# Patient Record
Sex: Male | Born: 1995 | Race: Black or African American | Hispanic: No | Marital: Single | Smoking: Current every day smoker
Health system: Southern US, Community
[De-identification: ages and names within clinical notes are randomized; demographics above are authoritative.]

## PROBLEM LIST (undated history)

## (undated) DIAGNOSIS — J45909 Unspecified asthma, uncomplicated: Secondary | ICD-10-CM

---
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
## 2017-03-02 ENCOUNTER — Encounter: Payer: Self-pay | Admitting: Dietician

## 2017-03-02 NOTE — Progress Notes (Signed)
Medical Nutrition Therapy:  Appt start time: 12:45pm end time:  1:15pm   Assessment:  Primary concerns today: healthy weight and muscle gain in the off-season   Learning Readiness:  Ready  MEDICATIONS: see chart   DIETARY INTAKE: Usual eating pattern includes 3 meals and 0 snacks per day. Lunch and dinner vary depending on what is served in [employer] dining halls.  Everyday foods include: fruit, usually chicken.  Avoided foods include: yogurt, cottage cheese.    24-hr recall:  B ( AM): sausage + fruit + cereal or biscuit Snk ( AM): n/a L ( PM): chicken, hot dog, [place] to order chicken tenders + fries Snk ( PM): n/a D ( PM): similar to lunch Snk ( PM): n/a Beverages: water, Gatorade, PowerAde  Usual physical activity: football player. Current schedule calls for 3-4 days/wk workout, lifting focus.  Estimated energy needs: 2786 calories (maintenance) 348 g carbohydrates 162 g protein 83 g fat   Nutritional Diagnosis: Nutrition- related knowledge deficit r/t methods of weight gain and sources of dietary carbohydrates AEB patient's inability to name sources of carbohydrates and stated lack of knowledge regarding how to gain weight in a healthy manor.    Intervention:  Total wt gain goal: 10-12# where weight is coming from increase in muscle mass with minimal fat gain. Seeking knowledge of how to put on weight in a healthy way. Recommendations: adding an additional 250-500kcal /day for healthy wt gain of 1/2 - 2# / week. Interventions discussed include: creating balanced meals, choosing a diet high in both calories and quality, using guidelines for percent of total daily calories that should come from [facility], PRO, Fat for football players, addressing meal timing around workouts, choosing better quality protein sources at breakfast, adding in a snack each day, finding ways to "sneak in" calories at meal times (dressings, condiments or dressings, guacamole, butter, granola, EVOO, dried  fruit, cheese, nuts/ nut butters, trail mix, 2% milk over skim, bars, protein shakes only in a pinch or post workout). Ability to utilize liquid calories also discussed.  Teaching Method Utilized: Auditory  Handouts given during visit include:  email  Barriers to learning/adherence to lifestyle change: poor quality diet, needs addressed before healthy wt gain can be fully addressed  Demonstrated degree of understanding via:  Teach Back   Monitoring/Evaluation:  Dietary intake, exercise, and body weight

## 2017-05-27 ENCOUNTER — Emergency Department: Payer: Medicaid Other

## 2017-05-27 ENCOUNTER — Observation Stay
Admission: EM | Admit: 2017-05-27 | Discharge: 2017-05-28 | Disposition: A | Discharge status: Home / Self Care | Payer: Medicaid Other | Attending: Internal Medicine | Admitting: Internal Medicine

## 2017-05-27 ENCOUNTER — Other Ambulatory Visit: Payer: Self-pay

## 2017-05-27 DIAGNOSIS — J189 Pneumonia, unspecified organism: Secondary | ICD-10-CM | POA: Diagnosis present

## 2017-05-27 DIAGNOSIS — R0603 Acute respiratory distress: Secondary | ICD-10-CM | POA: Diagnosis present

## 2017-05-27 DIAGNOSIS — J209 Acute bronchitis, unspecified: Secondary | ICD-10-CM | POA: Insufficient documentation

## 2017-05-27 DIAGNOSIS — J9801 Acute bronchospasm: Secondary | ICD-10-CM | POA: Diagnosis present

## 2017-05-27 DIAGNOSIS — Z882 Allergy status to sulfonamides status: Secondary | ICD-10-CM | POA: Insufficient documentation

## 2017-05-27 DIAGNOSIS — J9600 Acute respiratory failure, unspecified whether with hypoxia or hypercapnia: Secondary | ICD-10-CM

## 2017-05-27 DIAGNOSIS — J4541 Moderate persistent asthma with (acute) exacerbation: Secondary | ICD-10-CM

## 2017-05-27 DIAGNOSIS — F17210 Nicotine dependence, cigarettes, uncomplicated: Secondary | ICD-10-CM | POA: Insufficient documentation

## 2017-05-27 DIAGNOSIS — R531 Weakness: Secondary | ICD-10-CM | POA: Insufficient documentation

## 2017-05-27 DIAGNOSIS — J9601 Acute respiratory failure with hypoxia: Principal | ICD-10-CM | POA: Insufficient documentation

## 2017-05-27 DIAGNOSIS — J9690 Respiratory failure, unspecified, unspecified whether with hypoxia or hypercapnia: Secondary | ICD-10-CM | POA: Diagnosis present

## 2017-05-27 LAB — URINE DRUG SCREEN, QUALITATIVE (ARMC ONLY)
Amphetamines, Ur Screen: NOT DETECTED
Barbiturates, Ur Screen: NOT DETECTED
Benzodiazepine, Ur Scrn: NOT DETECTED
COCAINE METABOLITE, UR ~~LOC~~: NOT DETECTED
Cannabinoid 50 Ng, Ur ~~LOC~~: POSITIVE — AB
MDMA (ECSTASY) UR SCREEN: NOT DETECTED
METHADONE SCREEN, URINE: NOT DETECTED
OPIATE, UR SCREEN: POSITIVE — AB
Phencyclidine (PCP) Ur S: NOT DETECTED
TRICYCLIC, UR SCREEN: NOT DETECTED

## 2017-05-27 LAB — CBC WITH DIFFERENTIAL/PLATELET
BASOS PCT: 0 %
Basophils Absolute: 0 10*3/uL (ref 0–0.1)
EOS ABS: 0.3 10*3/uL (ref 0–0.7)
Eosinophils Relative: 2 %
HCT: 50.7 % (ref 40.0–52.0)
Hemoglobin: 17.2 g/dL (ref 13.0–18.0)
Lymphocytes Relative: 8 %
Lymphs Abs: 1.1 10*3/uL (ref 1.0–3.6)
MCH: 28.6 pg (ref 26.0–34.0)
MCHC: 34 g/dL (ref 32.0–36.0)
MCV: 84.2 fL (ref 80.0–100.0)
MONO ABS: 1.3 10*3/uL — AB (ref 0.2–1.0)
MONOS PCT: 10 %
Neutro Abs: 10.2 10*3/uL — ABNORMAL HIGH (ref 1.4–6.5)
Neutrophils Relative %: 80 %
PLATELETS: 159 10*3/uL (ref 150–440)
RBC: 6.02 MIL/uL — ABNORMAL HIGH (ref 4.40–5.90)
RDW: 13.1 % (ref 11.5–14.5)
WBC: 12.8 10*3/uL — ABNORMAL HIGH (ref 3.8–10.6)

## 2017-05-27 LAB — URINALYSIS, ROUTINE W REFLEX MICROSCOPIC
Bilirubin Urine: NEGATIVE
GLUCOSE, UA: NEGATIVE mg/dL
Hgb urine dipstick: NEGATIVE
KETONES UR: NEGATIVE mg/dL
LEUKOCYTES UA: NEGATIVE
NITRITE: NEGATIVE
PH: 6 (ref 5.0–8.0)
Protein, ur: NEGATIVE mg/dL
SPECIFIC GRAVITY, URINE: 1.029 (ref 1.005–1.030)

## 2017-05-27 LAB — RAPID HIV SCREEN (HIV 1/2 AB+AG)
HIV 1/2 Antibodies: NONREACTIVE
HIV-1 P24 ANTIGEN - HIV24: NONREACTIVE

## 2017-05-27 LAB — BASIC METABOLIC PANEL
Anion gap: 9 (ref 5–15)
BUN: 10 mg/dL (ref 6–20)
CO2: 25 mmol/L (ref 22–32)
CREATININE: 1.05 mg/dL (ref 0.61–1.24)
Calcium: 9.7 mg/dL (ref 8.9–10.3)
Chloride: 101 mmol/L (ref 101–111)
Glucose, Bld: 99 mg/dL (ref 65–99)
Potassium: 3.9 mmol/L (ref 3.5–5.1)
SODIUM: 135 mmol/L (ref 135–145)

## 2017-05-27 LAB — TROPONIN I

## 2017-05-27 LAB — LACTIC ACID, PLASMA: Lactic Acid, Venous: 0.7 mmol/L (ref 0.5–1.9)

## 2017-05-27 MED ORDER — NICOTINE 21 MG/24HR TD PT24
21.0000 mg | MEDICATED_PATCH | Freq: Every day | TRANSDERMAL | Status: DC
  Administered 2017-05-27: 21 mg via TRANSDERMAL
  Filled 2017-05-27: qty 1

## 2017-05-27 MED ORDER — METHYLPREDNISOLONE SODIUM SUCC 125 MG IJ SOLR
125.0000 mg | Freq: Once | INTRAMUSCULAR | Status: AC
  Administered 2017-05-27: 125 mg via INTRAVENOUS
  Filled 2017-05-27: qty 2

## 2017-05-27 MED ORDER — ALBUTEROL SULFATE (2.5 MG/3ML) 0.083% IN NEBU
2.5000 mg | INHALATION_SOLUTION | Freq: Once | RESPIRATORY_TRACT | Status: AC
  Administered 2017-05-27: 2.5 mg via RESPIRATORY_TRACT

## 2017-05-27 MED ORDER — IPRATROPIUM-ALBUTEROL 0.5-2.5 (3) MG/3ML IN SOLN
3.0000 mL | RESPIRATORY_TRACT | Status: DC
  Administered 2017-05-27 – 2017-05-28 (×6): 3 mL via RESPIRATORY_TRACT
  Filled 2017-05-27 (×7): qty 3

## 2017-05-27 MED ORDER — BISACODYL 5 MG PO TBEC
5.0000 mg | DELAYED_RELEASE_TABLET | Freq: Every day | ORAL | Status: DC | PRN
  Filled 2017-05-27: qty 1

## 2017-05-27 MED ORDER — DOXYCYCLINE HYCLATE 100 MG IV SOLR
100.0000 mg | Freq: Once | INTRAVENOUS | Status: DC
  Filled 2017-05-27: qty 100

## 2017-05-27 MED ORDER — MORPHINE SULFATE (PF) 4 MG/ML IV SOLN
4.0000 mg | Freq: Once | INTRAVENOUS | Status: AC
  Administered 2017-05-27: 4 mg via INTRAVENOUS
  Filled 2017-05-27: qty 1

## 2017-05-27 MED ORDER — ACETAMINOPHEN 325 MG PO TABS: 650.0000 mg | ORAL_TABLET | Freq: Four times a day (QID) | ORAL | Status: DC | PRN

## 2017-05-27 MED ORDER — ACETAMINOPHEN 650 MG RE SUPP: 650.0000 mg | Freq: Four times a day (QID) | RECTAL | Status: DC | PRN

## 2017-05-27 MED ORDER — PIPERACILLIN-TAZOBACTAM 3.375 G IVPB
3.3750 g | Freq: Three times a day (TID) | INTRAVENOUS | Status: DC
  Administered 2017-05-27 – 2017-05-28 (×2): 3.375 g via INTRAVENOUS
  Filled 2017-05-27 (×2): qty 50

## 2017-05-27 MED ORDER — ONDANSETRON HCL 4 MG PO TABS: 4.0000 mg | ORAL_TABLET | Freq: Four times a day (QID) | ORAL | Status: DC | PRN

## 2017-05-27 MED ORDER — ONDANSETRON HCL 4 MG/2ML IJ SOLN: 4.0000 mg | Freq: Four times a day (QID) | INTRAMUSCULAR | Status: DC | PRN

## 2017-05-27 MED ORDER — SODIUM CHLORIDE 0.9 % IV BOLUS (SEPSIS)
1000.0000 mL | Freq: Once | INTRAVENOUS | Status: AC
  Administered 2017-05-27: 1000 mL via INTRAVENOUS

## 2017-05-27 MED ORDER — DEXTROSE 5 % IV SOLN: 1.0000 g | INTRAVENOUS | Status: DC

## 2017-05-27 MED ORDER — ENOXAPARIN SODIUM 40 MG/0.4ML ~~LOC~~ SOLN
40.0000 mg | SUBCUTANEOUS | Status: DC
  Filled 2017-05-27: qty 0.4

## 2017-05-27 MED ORDER — HYDROCODONE-ACETAMINOPHEN 5-325 MG PO TABS
1.0000 | ORAL_TABLET | ORAL | Status: DC | PRN
  Administered 2017-05-27: 2 via ORAL
  Administered 2017-05-27: 10:00:00 1 via ORAL
  Administered 2017-05-27 – 2017-05-28 (×2): 2 via ORAL
  Filled 2017-05-27 (×2): qty 2
  Filled 2017-05-27: qty 1
  Filled 2017-05-27: qty 2

## 2017-05-27 MED ORDER — IOPAMIDOL (ISOVUE-370) INJECTION 76%
100.0000 mL | Freq: Once | INTRAVENOUS | Status: AC | PRN
  Administered 2017-05-27: 100 mL via INTRAVENOUS

## 2017-05-27 MED ORDER — GUAIFENESIN-DM 100-10 MG/5ML PO SYRP
5.0000 mL | ORAL_SOLUTION | ORAL | Status: DC | PRN
  Administered 2017-05-27 – 2017-05-28 (×4): 5 mL via ORAL
  Filled 2017-05-27 (×5): qty 5

## 2017-05-27 MED ORDER — SENNOSIDES-DOCUSATE SODIUM 8.6-50 MG PO TABS: 1.0000 | ORAL_TABLET | Freq: Every evening | ORAL | Status: DC | PRN

## 2017-05-27 MED ORDER — PIPERACILLIN-TAZOBACTAM 3.375 G IVPB 30 MIN
3.3750 g | Freq: Three times a day (TID) | INTRAVENOUS | Status: DC
  Filled 2017-05-27 (×2): qty 50

## 2017-05-27 MED ORDER — ALBUTEROL SULFATE (2.5 MG/3ML) 0.083% IN NEBU
INHALATION_SOLUTION | RESPIRATORY_TRACT | Status: AC
  Filled 2017-05-27: qty 6

## 2017-05-27 MED ORDER — PIPERACILLIN-TAZOBACTAM 3.375 G IVPB 30 MIN
3.3750 g | Freq: Once | INTRAVENOUS | Status: AC
  Administered 2017-05-27: 10:00:00 3.375 g via INTRAVENOUS
  Filled 2017-05-27: qty 50

## 2017-05-27 MED ORDER — DEXTROSE 5 % IV SOLN: 500.0000 mg | INTRAVENOUS | Status: DC

## 2017-05-27 MED ORDER — ONDANSETRON HCL 4 MG/2ML IJ SOLN
4.0000 mg | Freq: Once | INTRAMUSCULAR | Status: AC
  Administered 2017-05-27: 4 mg via INTRAVENOUS
  Filled 2017-05-27: qty 2

## 2017-05-27 MED ORDER — DEXTROSE 5 % IV SOLN
500.0000 mg | Freq: Once | INTRAVENOUS | Status: AC
  Administered 2017-05-27: 500 mg via INTRAVENOUS
  Filled 2017-05-27: qty 500

## 2017-05-27 MED ORDER — PIPERACILLIN-TAZOBACTAM 3.375 G IVPB: 3.3750 g | Freq: Three times a day (TID) | INTRAVENOUS | Status: DC

## 2017-05-27 MED ORDER — DEXTROSE 5 % IV SOLN
1.0000 g | Freq: Once | INTRAVENOUS | Status: AC
  Administered 2017-05-27: 1 g via INTRAVENOUS
  Filled 2017-05-27: qty 10

## 2017-05-27 MED ORDER — METHYLPREDNISOLONE SODIUM SUCC 125 MG IJ SOLR: 60.0000 mg | INTRAMUSCULAR | Status: DC

## 2017-05-27 MED ORDER — SODIUM CHLORIDE 0.9 % IV SOLN
INTRAVENOUS | Status: DC
  Administered 2017-05-27 – 2017-05-28 (×2): via INTRAVENOUS

## 2017-05-27 MED ORDER — MAGNESIUM SULFATE 2 GM/50ML IV SOLN
2.0000 g | Freq: Once | INTRAVENOUS | Status: AC
  Administered 2017-05-27: 2 g via INTRAVENOUS
  Filled 2017-05-27: qty 50

## 2017-05-27 NOTE — Clinical Social Work Note (Signed)
Clinical Social Work Assessment  Patient Details  Name: Kyle PaganiniZavier J Heideman MRN: [id number] Date of Birth: 07/20/1996  Date of referral:  05/27/17               Reason for consult:  [employer] [facility], Housing Concerns/Homelessness                Permission sought to share information with:    Permission granted to share information::     Name::        Agency::     Relationship::     Contact Information:     Housing/Transportation Living arrangements for the past 2 months:  Homeless Source of Information:  Patient Patient Interpreter Needed:  None Criminal Activity/Legal Involvement Pertinent to Current Situation/Hospitalization:  No - Comment as needed Significant Relationships:  Dependent Children, Siblings, Parents Lives with:  Self Do you feel safe going back to the place where you live?  No Need for family participation in patient care:  No (Coment)  Care giving concerns:  Patient is homeless with medical needs   Social Worker assessment / plan: CSW visited patient at bedside to discuss discharge planning. The patient confirmed that he is currently homeless. For the past 60 days, the patient had been [place] [employer], but he no longer has his car. The patient is not able to continue living with his sister and mother in [place] due to family discord with his sister and because of room in the home. The patient has 2 children who live with their mother in [place] that he attempts to support, but lack of transportation and finances has been a burden. The CSW provided the [facility] resource list to assist the patient in planning for housing need post-discharge. In the meantime, the patient is willing to [facility]. The RNCM has alerted the MD that the patient's medication recommendation is not on the [facility].   The CSW will continue to follow to facilitate discharge to the local shelter.   Employment status:   Unemployed Health and safety inspectornsurance information:  [place] Recommendations:  Not assessed at this time Information / Referral to community resources:  Shelter  Patient/Family's Response to care:  The patient thanked the CSW for assistance.   Patient/Family's Understanding of and Emotional Response to Diagnosis, Current Treatment, and Prognosis:  The patient understands his diagnosis and needs for housekeeping.    Emotional Assessment Appearance:  Appears stated age Attitude/Demeanor/Rapport:   (Pleasant) Affect (typically observed):  Accepting, Appropriate, Pleasant Orientation:  Oriented to Self, Oriented to Place, Oriented to  Time, Oriented to Situation Alcohol / Substance use:  Tobacco Use Psych involvement (Current and /or in the community):  No (Comment)  Discharge Needs  Concerns to be addressed:  Adjustment to Illness, Basic Needs, Homelessness, Discharge Planning Concerns Readmission within the last 30 days:  No Current discharge risk:  Homeless Barriers to Discharge:  Continued Medical Work up   [employer] CorporationKaren M Diya Gervasi, LCSW 05/27/2017, 3:03 PM

## 2017-05-27 NOTE — ED Notes (Signed)
Patient ready for transport. Stable at this time

## 2017-05-27 NOTE — ED Provider Notes (Signed)
[facility] Emergency Department Provider Note  ____________________________________________   First MD Initiated Contact with Patient 05/27/17 [phone]     (approximate)  I have reviewed the triage vital signs and the nursing notes.   HISTORY  Chief Complaint Respiratory Distress   HPI Kyle Kelly is a 21 y.o. male with a history of asthma who is presenting to the emergency department today with shortness of breath over the past 2 days. He says that the shortness of breath acutely worsened about 2 hours ago when he was smoking a cigarette and had a coughing fit. He is describing sharp pain to the center of his chest especially when he takes a deep breath. Says that he also went swimming 2 days ago and swallow large amount of water and is concerned this may be having an effect on his breathing right now as well.   Past Medical History:  Diagnosis Date  . Asthma    not recent    There are no active problems to display for this patient.   History reviewed. No pertinent surgical history.  Prior to Admission medications   Medication Sig Start Date End Date Taking? Authorizing Provider  ibuprofen (ADVIL,MOTRIN) 600 MG tablet Take 1 tablet (600 mg total) by mouth 3 (three) times daily. 11/30/14   Earley FavorSchulz, Gail, NP  lisdexamfetamine (VYVANSE) 60 MG capsule Take 60 mg by mouth daily as needed. focus    [provider]    Allergies Sulfonylureas  No family history on file.  Social History Social History  Substance Use Topics  . Smoking status: Current Every Day Smoker  . Smokeless tobacco: Not on file  . Alcohol use No    Review of Systems  Constitutional: No fever/chills Eyes: No visual changes. ENT: No sore throat. Cardiovascular: as above Respiratory: as above Gastrointestinal: No abdominal pain.  No nausea, no vomiting.  No diarrhea.  No constipation. Genitourinary: Negative for dysuria. Musculoskeletal: Negative for back  pain. Skin: Negative for rash. Neurological: Negative for headaches, focal weakness or numbness.   ____________________________________________   PHYSICAL EXAM:  VITAL SIGNS: ED Triage Vitals  Enc Vitals Group     BP 05/27/17 0329 139/77     Pulse Rate 05/27/17 0329 82     Resp 05/27/17 0329 20     Temp 05/27/17 0329 99.4 F (37.4 C)     Temp Source 05/27/17 0329 Oral     SpO2 05/27/17 0329 98 %     Weight 05/27/17 0326 202 lb (91.6 kg)     Height 05/27/17 0326 6\' 1"  (1.854 m)     Head Circumference --      Peak Flow --      Pain Score 05/27/17 0326 9     Pain Loc --      Pain Edu? --      Excl. in GC? --     Constitutional: Alert and oriented. Well appearing and in no acute distress. Eyes: Conjunctivae are normal.  Head: Atraumatic. Nose: No congestion/rhinnorhea. Mouth/Throat: Mucous membranes are moist.  Neck: No stridor.   Cardiovascular: Normal rate, regular rhythm. Grossly normal heart sounds.  Good peripheral circulation. Respiratory: Tachypneic and using accessory muscles. Speaking in 2-3 word sentences with diffuse wheezing. Gastrointestinal: Soft and nontender. No distention. No CVA tenderness. Musculoskeletal: No lower extremity tenderness nor edema.  No joint effusions. Neurologic:  Normal speech and language. No gross focal neurologic deficits are appreciated. Skin:  Skin is warm, dry and intact. No rash noted.  Psychiatric: Mood and affect are normal. Speech and behavior are normal.  ____________________________________________   LABS (all labs ordered are listed, but only abnormal results are displayed)  Labs Reviewed  CBC WITH DIFFERENTIAL/PLATELET - Abnormal; Notable for the following:       Result Value   WBC 12.8 (*)    RBC 6.02 (*)    Neutro Abs 10.2 (*)    Monocytes Absolute 1.3 (*)    All other components within normal limits  BASIC METABOLIC PANEL  TROPONIN I   ____________________________________________  EKG  ED ECG REPORT I,  Arelia LongestSchaevitz,  David M, the attending physician, personally viewed and interpreted this ECG.   Date: 05/27/2017  EKG Time: 0457  Rate: 81  Rhythm: normal sinus rhythm  Axis: Normal  Intervals:none  ST&T Change: No ST segment elevation or depression. No abnormal T-wave inversion.  ____________________________________________  RADIOLOGY  No acute finding on the chest x-ray  No PE found on the CT angiography of the chest. ____________________________________________   PROCEDURES  Procedure(s) performed:   Procedures  Critical Care performed:  CRITICAL CARE Performed by: Arelia LongestSchaevitz,  David M   Total critical care time: 35 minutes  Critical care time was exclusive of separately billable procedures and treating other patients.  Critical care was necessary to treat or prevent imminent or life-threatening deterioration.  Critical care was time spent personally by me on the following activities: development of treatment plan with patient and/or surrogate as well as nursing, discussions with consultants, evaluation of patient's response to treatment, examination of patient, obtaining history from patient or surrogate, ordering and performing treatments and interventions, ordering and review of laboratory studies, ordering and review of radiographic studies, pulse oximetry and re-evaluation of patient's condition.     ____________________________________________   INITIAL IMPRESSION / ASSESSMENT AND PLAN / ED COURSE  Pertinent labs & imaging results that were available during my care of the patient were reviewed by me and considered in my medical decision making (see chart for details).  ----------------------------------------- 6:09 AM on 05/27/2017 -----------------------------------------  Patient was on BiPAP and is still complaining of chest pain although his lungs are now clear to auscultation. We will perform a CTA to make sure the patient does not have already embolus.  Plan on admitting to the hospital. Signed out to Dr. Sheryle Haildiamond. The patient is aware that he will need to stay at the hospital for further evaluation and treatment.    ----------------------------------------- 6:45 AM on 05/27/2017 -----------------------------------------  Patient tolerating the BiPAP well. Will be admitted for dyspnea related to bronchospasm. Patient is understanding the plan and willing to comply. Signed out to Dr. Sheryle Haildiamond.  ____________________________________________   FINAL CLINICAL IMPRESSION(S) / ED DIAGNOSES  Bronchospasm. Respiratory distress.    NEW MEDICATIONS STARTED DURING THIS VISIT:  New Prescriptions   No medications on file     Note:  This document was prepared using Dragon voice recognition software and may include unintentional dictation errors.     Myrna BlazerSchaevitz, David Matthew, MD 05/27/17 [phone]

## 2017-05-27 NOTE — Care Management Note (Addendum)
Case Management Note  Patient Details  Name: Kyle Kelly MRN: [id number] Date of Birth: 01/21/1996  Subjective/Objective:     Consulted with weekend SW. Kyle Kelly has active Medicaid. Weekend SW has determined that Kyle Kelly is homeless. Kyle Kelly has agreed to be discharged to the homeless shelter upon hospital discharge. Dr Nicholos Johnsamachandran, Pulmonology, is recommending discharge with QVAR which [facility] not [facility]. [facility] who suggested Budesonide which is on [facility].                 Action/Plan:   Expected Discharge Date:                  Expected Discharge Plan:     In-House Referral:     Discharge planning Services     Post Acute Care Choice:    Choice offered to:     DME Arranged:    DME Agency:     HH Arranged:    HH Agency:     Status of Service:     If discussed at [employer] Length of Stay Meetings, dates discussed:    Additional Comments:  Kyle Gonzaga A, RN 05/27/2017, 11:50 AM

## 2017-05-27 NOTE — ED Notes (Signed)
Admitting MD at bedside.

## 2017-05-27 NOTE — Consult Note (Signed)
[facility] Consultation      Assessment and Plan:  Acute asthma exacerbation with acute bronchitis. --Continue IV steroids, will need to start qvar 80, 2 puffs bid on discharge.  --Doubt pneumonia, would de-escalate abx coverage.  --Follow up outpatient in 4 weeks, pt was given my card.   Nicotine Abuse.  --Discussed the importance of smoking cessation to reduce future asthma flares.   CT chest images personally reviewed; scattered areas of ground glass, likely secondary to air trapping with asthma exacerbation.    Date: 05/27/2017  MRN# [id number] Kyle Kelly 05/10/1996  Referring Physician: Dr. Juliene PinaMody for dyspnea.   Kyle Kelly is a 21 y.o. old male seen in consultation for chief complaint of:    Chief Complaint  Patient presents with  . Respiratory Distress    HPI:   The patient is a 21 yo male with no prior history respiratory problems. He tells me that a couple of years ago in high school he used to run track. He has a dog at home, not in bedroom, he does not recall being tested for allergies. He smokes about 8-9 cigs per day. He has never been diagnosed with asthma.  He notes that his mother has asthma.  He presented with the hospital with right sided chest pain and dyspnea with severe coughing fits made worse with deep breaths or laying down.  He notes that he has occasional dyspnea which is episodic, sometimes comes at night.    PMHX:   Past Medical History:  Diagnosis Date  . Asthma    not recent   Surgical Hx:  History reviewed. No pertinent surgical history. Family Hx:  History reviewed. No pertinent family history. Social Hx:   Social History  Substance Use Topics  . Smoking status: Current Every Day Smoker    Packs/day: 0.50    Years: 4.00    Types: Cigarettes  . Smokeless tobacco: Never Used  . Alcohol use No   Medication:    Current Facility-Administered Medications:  .  0.9 %  sodium chloride infusion, , Intravenous,  Continuous, Mody, Sital, MD .  acetaminophen (TYLENOL) tablet 650 mg, 650 mg, Oral, Q6H PRN **OR** acetaminophen (TYLENOL) suppository 650 mg, 650 mg, Rectal, Q6H PRN, Mody, Sital, MD .  albuterol (PROVENTIL) (2.5 MG/3ML) 0.083% nebulizer solution, , , ,  .  bisacodyl (DULCOLAX) EC tablet 5 mg, 5 mg, Oral, Daily PRN, Mody, Sital, MD .  enoxaparin (LOVENOX) injection 40 mg, 40 mg, Subcutaneous, Q24H, Mody, Sital, MD .  guaiFENesin-dextromethorphan (ROBITUSSIN DM) 100-10 MG/5ML syrup 5 mL, 5 mL, Oral, Q4H PRN, Mody, Sital, MD .  HYDROcodone-acetaminophen (NORCO/VICODIN) 5-325 MG per tablet 1-2 tablet, 1-2 tablet, Oral, Q4H PRN, Mody, Sital, MD .  ipratropium-albuterol (DUONEB) 0.5-2.5 (3) MG/3ML nebulizer solution 3 mL, 3 mL, Nebulization, Q4H, Mody, Sital, MD .  methylPREDNISolone sodium succinate (SOLU-MEDROL) 125 mg/2 mL injection 60 mg, 60 mg, Intravenous, Q24H, Mody, Sital, MD, Stopped at 05/27/17 0800 .  ondansetron (ZOFRAN) tablet 4 mg, 4 mg, Oral, Q6H PRN **OR** ondansetron (ZOFRAN) injection 4 mg, 4 mg, Intravenous, Q6H PRN, Mody, Sital, MD .  piperacillin-tazobactam (ZOSYN) IVPB 3.375 g, 3.375 g, Intravenous, Once, Mody, Sital, MD .  piperacillin-tazobactam (ZOSYN) IVPB 3.375 g, 3.375 g, Intravenous, Q8H, Mody, Sital, MD .  senna-docusate (Senokot-S) tablet 1 tablet, 1 tablet, Oral, QHS PRN, Juliene PinaMody, Sital, MD   Allergies:  Sulfonylureas; Sulfa antibiotics; and Sulfur  Review of Systems: Gen:  Denies  fever, sweats, chills HEENT: Denies  blurred vision, double vision. bleeds, sore throat Cvc:  No dizziness, chest pain. Resp:   Denies cough or sputum production, Gi: Denies swallowing difficulty, stomach pain. Gu:  Denies bladder incontinence, burning urine Ext:   No Joint pain, stiffness. Skin: No skin rash,  hives  Endoc:  No polyuria, polydipsia. Psych: No depression, insomnia. Other:  All other systems were reviewed with the patient and were negative other that what is mentioned in  the HPI.   Physical Examination:   VS: BP (!) 143/74 (BP Location: Right Arm)   Pulse 81   Temp 99.1 F (37.3 C) (Axillary)   Resp 20   Ht 6\' 1"  (1.854 m)   Wt 193 lb 3.2 oz (87.6 kg)   SpO2 95%   BMI 25.49 kg/m   General Appearance: No distress  Neuro:without focal findings,  speech normal,  HEENT: PERRLA, EOM intact.   Pulmonary: Scattered bilateral wheezing.  CardiovascularNormal S1,S2.  No m/r/g.   Abdomen: Benign, Soft, non-tender. Renal:  No costovertebral tenderness  GU:  No performed at this time. Endoc: No evident thyromegaly, no signs of acromegaly. Skin:   warm, no rashes, no ecchymosis  Extremities: normal, no cyanosis, clubbing.  Other findings:    LABORATORY PANEL:   CBC  Recent Labs Lab 05/27/17 0535  WBC 12.8*  HGB 17.2  HCT 50.7  PLT 159   ------------------------------------------------------------------------------------------------------------------  Chemistries   Recent Labs Lab 05/27/17 0535  NA 135  K 3.9  CL 101  CO2 25  GLUCOSE 99  BUN 10  CREATININE 1.05  CALCIUM 9.7   ------------------------------------------------------------------------------------------------------------------  Cardiac Enzymes  Recent Labs Lab 05/27/17 0535  TROPONINI <0.03   ------------------------------------------------------------  RADIOLOGY:  Dg Chest 2 View  Result Date: 05/27/2017 CLINICAL DATA:  Two day history of dyspnea and sharp right chest pain. Uncontrollable cough. EXAM: CHEST  2 VIEW COMPARISON:  07/03/2010 FINDINGS: The lungs are clear. The pulmonary vasculature is normal. Heart size is normal. Hilar and mediastinal contours are unremarkable. There is no pleural effusion. IMPRESSION: No active cardiopulmonary disease. Electronically Signed   By: Ellery Plunkaniel R Mitchell M.D.   On: 05/27/2017 03:57   Ct Angio Chest Pe W And/or Wo Contrast  Result Date: 05/27/2017 CLINICAL DATA:  Two days of dyspnea. Uncontrollable coughing, with a  positional component. EXAM: CT ANGIOGRAPHY CHEST WITH CONTRAST TECHNIQUE: Multidetector CT imaging of the chest was performed using the standard protocol during bolus administration of intravenous contrast. Multiplanar CT image reconstructions and MIPs were obtained to evaluate the vascular anatomy. CONTRAST:  100 mL Isovue 370 intravenous COMPARISON:  Radiographs 05/27/2017 FINDINGS: Cardiovascular: Satisfactory opacification of the pulmonary arteries to the segmental level. No evidence of pulmonary embolism. Normal heart size. No pericardial effusion. Mediastinum/Nodes: No enlarged mediastinal, hilar, or axillary lymph nodes. Thyroid gland, trachea, and esophagus demonstrate no significant findings. Lungs/Pleura: Scattered patchy and nodular airspace opacities involving upper lobes and lower lobes bilaterally. This may represent multifocal pneumonia. Inflammatory processes are also possible. Airways are patent and normal in caliber.  No pleural effusions. Upper Abdomen: No significant abnormality. Musculoskeletal: No significant skeletal abnormality. Review of the MIP images confirms the above findings. IMPRESSION: Negative for acute pulmonary embolism. Multifocal patchy airspace opacities bilaterally may represent pneumonia. Electronically Signed   By: Ellery Plunkaniel R Mitchell M.D.   On: 05/27/2017 06:53       Thank  you for the consultation and for allowing [facility] to assist in the care of your patient. Our recommendations are noted above.  Please contact us if we can be of further service.   Wells Guiles, MD.  Board Certified in Internal Medicine, Pulmonary Medicine, Critical Care Medicine, and Sleep Medicine.  [facility] Office Number: [phone]  Santiago Glad, M.D.  Billy Fischer, M.D  05/27/2017

## 2017-05-27 NOTE — Progress Notes (Signed)
Pharmacy Antibiotic Note  Kyle Kelly is a 21 y.o. male admitted on 05/27/2017 with Aspiration Pneumonia.  Pharmacy has been consulted for Zosyn dosing.  Plan: Zosyn 3.375g IV q8h (4 hour infusion).  Height: 6\' 1"  (185.4 cm) Weight: 193 lb 3.2 oz (87.6 kg) IBW/kg (Calculated) : 79.9  Temp (24hrs), Avg:99.5 F (37.5 C), Min:99.1 F (37.3 C), Max:100.1 F (37.8 C)   Recent Labs Lab 05/27/17 0535 05/27/17 0706  WBC 12.8*  --   CREATININE 1.05  --   LATICACIDVEN  --  0.7    Estimated Creatinine Clearance: 127.9 mL/min (by C-G formula based on SCr of 1.05 mg/dL).    Allergies  Allergen Reactions  . Sulfonylureas Anaphylaxis  . Sulfa Antibiotics   . Sulfur     Antimicrobials this admission: Zosyn 7/28 >>  Ceftriaxone and Azithromycin 7/28 >> Once ED   Dose adjustments this admission:   Microbiology results: 7/28 BCx: Sent   Thank you for allowing pharmacy to be a part of this patient's care.  Cleopatra CedarStephanie Velina Drollinger  Pharmacy Resident 05/27/2017 9:55 AM

## 2017-05-27 NOTE — Progress Notes (Addendum)
Pt has RED allergy armband on

## 2017-05-27 NOTE — Progress Notes (Signed)
Pharmacy Antibiotic Note  Desma PaganiniZavier J Kirtley is a 21 y.o. male admitted on 05/27/2017 with pneumonia.  Pharmacy has been consulted for azithromycin and ceftriaxone dosing.  Plan: Azithromycin 500 mg IV q 24 hours and ceftriaxone 1 gram q 24 hours ordered.  Height: 6\' 1"  (185.4 cm) Weight: 202 lb (91.6 kg) IBW/kg (Calculated) : 79.9  Temp (24hrs), Avg:99.8 F (37.7 C), Min:99.4 F (37.4 C), Max:100.1 F (37.8 C)   Recent Labs Lab 05/27/17 0535  WBC 12.8*  CREATININE 1.05    Estimated Creatinine Clearance: 127.9 mL/min (by C-G formula based on SCr of 1.05 mg/dL).    Allergies  Allergen Reactions  . Sulfonylureas Anaphylaxis    Antimicrobials this admission: azithromycin ceftriaxone 7/28 >>    >>   Dose adjustments this admission:   Microbiology results: 7/28 BCx: pending  Thank you for allowing pharmacy to be a part of this patient's care.  Mayes Sangiovanni S 05/27/2017 7:21 AM

## 2017-05-27 NOTE — ED Notes (Signed)
Bipap d/c'd by RRT.

## 2017-05-27 NOTE — H&P (Signed)
[facility] [place] at [facility]   PATIENT NAME: Kyle Kelly    MR#:  [id number]  DATE OF BIRTH:  02/10/1996  DATE OF ADMISSION:  05/27/2017  PRIMARY CARE PHYSICIAN: Patient, No Pcp Per   REQUESTING/REFERRING PHYSICIAN: dr Langston Maskershaevitz  CHIEF COMPLAINT:   sob HISTORY OF PRESENT ILLNESS:  Kyle Kelly  is a 21 y.o. male with a known history of Tobacco dependence and mild intermittent asthma who presents with above complaint. Patient reports over the past 2 days he has had increasing shortness of breath, wheezing and dyspnea exertion along with cough. He does state that 2 days ago he jumped into a swimming pool and May of inhale [place]. No sick contacts no recent travel. He does smoke cigarettes heavily. He denies any other drug use or inhalation of any other substances. In the emergency room he was found to have acute respiratory failure and placed on BiPAP machine. He is now off of BiPAP on nasal cannula.  PAST MEDICAL HISTORY:   Past Medical History:  Diagnosis Date  . Asthma    not recent    PAST SURGICAL HISTORY:  None  SOCIAL HISTORY:   Social History  Substance Use Topics  . Smoking status: Current Every Day Smoker  . Smokeless tobacco: No  . Alcohol use No    FAMILY HISTORY:  No history of CAD or diabetes  DRUG ALLERGIES:   Allergies  Allergen Reactions  . Sulfonylureas Anaphylaxis    REVIEW OF SYSTEMS:   Review of Systems  Constitutional: Positive for fever. Negative for chills and malaise/fatigue.  HENT: Negative.  Negative for ear discharge, ear pain, hearing loss, nosebleeds and sore throat.   Eyes: Negative.  Negative for blurred vision and pain.  Respiratory: Positive for cough, shortness of breath and wheezing. Negative for hemoptysis.   Cardiovascular: Negative.  Negative for chest pain, palpitations and leg swelling.  Gastrointestinal: Negative.  Negative for abdominal pain, blood in stool, diarrhea, nausea and vomiting.   Genitourinary: Negative.  Negative for dysuria.  Musculoskeletal: Negative.  Negative for back pain.  Skin: Negative.   Neurological: Positive for weakness. Negative for dizziness, tremors, speech change, focal weakness, seizures and headaches.  Endo/Heme/Allergies: Negative.  Does not bruise/bleed easily.  Psychiatric/Behavioral: Negative.  Negative for depression, hallucinations and suicidal ideas.    MEDICATIONS AT HOME:   Prior to Admission medications   Not on File      VITAL SIGNS:  Blood pressure 140/75, pulse 89, temperature 100.1 F (37.8 C), temperature source Oral, resp. rate (!) 21, height 6\' 1"  (1.854 m), weight 91.6 kg (202 lb), SpO2 96 %.  PHYSICAL EXAMINATION:   Physical Exam  Constitutional: He is oriented to person, place, and time and well-developed, well-nourished, and in no distress. No distress.  HENT:  Head: Normocephalic.  Eyes: No scleral icterus.  Neck: Normal range of motion. Neck supple. No JVD present. No tracheal deviation present.  Cardiovascular: Normal rate, regular rhythm and normal heart sounds.  Exam reveals no gallop and no friction rub.   No murmur heard. Pulmonary/Chest: Effort normal. No respiratory distress. He has wheezes. He has no rales. He exhibits no tenderness.  Abdominal: Soft. Bowel sounds are normal. He exhibits no distension and no mass. There is no tenderness. There is no rebound and no guarding.  Musculoskeletal: Normal range of motion. He exhibits no edema.  Neurological: He is alert and oriented to person, place, and time.  Skin: Skin is warm. No rash noted. No erythema.  Psychiatric: Affect and judgment normal.      LABORATORY PANEL:   CBC  Recent Labs Lab 05/27/17 0535  WBC 12.8*  HGB 17.2  HCT 50.7  PLT 159   ------------------------------------------------------------------------------------------------------------------  Chemistries   Recent Labs Lab 05/27/17 0535  NA 135  K 3.9  CL 101  CO2 25   GLUCOSE 99  BUN 10  CREATININE 1.05  CALCIUM 9.7   ------------------------------------------------------------------------------------------------------------------  Cardiac Enzymes  Recent Labs Lab 05/27/17 0535  TROPONINI <0.03   ------------------------------------------------------------------------------------------------------------------  RADIOLOGY:  Dg Chest 2 View  Result Date: 05/27/2017 CLINICAL DATA:  Two day history of dyspnea and sharp right chest pain. Uncontrollable cough. EXAM: CHEST  2 VIEW COMPARISON:  07/03/2010 FINDINGS: The lungs are clear. The pulmonary vasculature is normal. Heart size is normal. Hilar and mediastinal contours are unremarkable. There is no pleural effusion. IMPRESSION: No active cardiopulmonary disease. Electronically Signed   By: Ellery Plunkaniel R Mitchell M.D.   On: 05/27/2017 03:57   Ct Angio Chest Pe W And/or Wo Contrast  Result Date: 05/27/2017 CLINICAL DATA:  Two days of dyspnea. Uncontrollable coughing, with a positional component. EXAM: CT ANGIOGRAPHY CHEST WITH CONTRAST TECHNIQUE: Multidetector CT imaging of the chest was performed using the standard protocol during bolus administration of intravenous contrast. Multiplanar CT image reconstructions and MIPs were obtained to evaluate the vascular anatomy. CONTRAST:  100 mL Isovue 370 intravenous COMPARISON:  Radiographs 05/27/2017 FINDINGS: Cardiovascular: Satisfactory opacification of the pulmonary arteries to the segmental level. No evidence of pulmonary embolism. Normal heart size. No pericardial effusion. Mediastinum/Nodes: No enlarged mediastinal, hilar, or axillary lymph nodes. Thyroid gland, trachea, and esophagus demonstrate no significant findings. Lungs/Pleura: Scattered patchy and nodular airspace opacities involving upper lobes and lower lobes bilaterally. This may represent multifocal pneumonia. Inflammatory processes are also possible. Airways are patent and normal in caliber.  No pleural  effusions. Upper Abdomen: No significant abnormality. Musculoskeletal: No significant skeletal abnormality. Review of the MIP images confirms the above findings. IMPRESSION: Negative for acute pulmonary embolism. Multifocal patchy airspace opacities bilaterally may represent pneumonia. Electronically Signed   By: Ellery Plunkaniel R Mitchell M.D.   On: 05/27/2017 06:53    EKG:   Normal sinus rhythm no ST elevation or depression IMPRESSION AND PLAN:    Continue old male with history of mild intermittent asthma and tobacco dependence who presents with acute respiratory failure.  1. Acute hypoxic respiratory failure in the setting of possible aspiration pneumonia related to Chlorine inhalation versus atypical pneumonia Start Zosyn Wean oxygen as tolerated Check HIV status Pulmonary consultation Follow-up on final blood cultures  2. Pneumonia: Aspiration versus atypical pneumonia: Plan as outlined above  3. Mild intermittent asthma with bronchospasm exacerbation,mild: Start IV steroids And nebs  4. Tobacco dependence: Patient is encouraged to quit smoking. Counseling was provided for 4 minutes.   All the records are reviewed and case discussed with ED provider. Management plans discussed with the patient and he is in agreement  CODE STATUS: full  TOTAL TIME TAKING CARE OF THIS PATIENT: 45 minutes.    Ingri Diemer M.D on 05/27/2017 at 7:37 AM  Between 7am to 6pm - Pager - [phone]  After 6pm go to [email or web address] - password [email or web address]  [facility]  Office  [phone]  CC: Primary care physician; Patient, No Pcp Per

## 2017-05-27 NOTE — ED Triage Notes (Signed)
Patient with two days of respiratory difficulty. States he is coughing and can't stop. Sitting up straight makes the coughing better but laying down he continues to cough. Patient able to speak in complete sentences without difficulty.

## 2017-05-27 NOTE — Clinical Social Work Note (Signed)
CSW received consult that the patient is homeless. CSW will assess when able.  Kyle PonderKaren Martha Taydon Kelly, MSW, Theresia MajorsLCSWA [phone]

## 2017-05-27 NOTE — ED Notes (Signed)
To xray with radiology tech.

## 2017-05-28 ENCOUNTER — Inpatient Hospital Stay: Payer: Medicaid Other

## 2017-05-28 DIAGNOSIS — J209 Acute bronchitis, unspecified: Secondary | ICD-10-CM | POA: Diagnosis present

## 2017-05-28 LAB — BASIC METABOLIC PANEL
Anion gap: 8 (ref 5–15)
BUN: 11 mg/dL (ref 6–20)
CHLORIDE: 107 mmol/L (ref 101–111)
CO2: 25 mmol/L (ref 22–32)
CREATININE: 0.97 mg/dL (ref 0.61–1.24)
Calcium: 9.2 mg/dL (ref 8.9–10.3)
GFR calc Af Amer: >60 mL/min (ref >60)
GFR calc non Af Amer: >60 mL/min (ref >60)
GLUCOSE: 129 mg/dL — AB (ref 65–99)
POTASSIUM: 3.7 mmol/L (ref 3.5–5.1)
SODIUM: 140 mmol/L (ref 135–145)

## 2017-05-28 LAB — CBC
HEMATOCRIT: 42.1 % (ref 40.0–52.0)
Hemoglobin: 14.5 g/dL (ref 13.0–18.0)
MCH: 29.3 pg (ref 26.0–34.0)
MCHC: 34.5 g/dL (ref 32.0–36.0)
MCV: 85 fL (ref 80.0–100.0)
Platelets: 153 10*3/uL (ref 150–440)
RBC: 4.96 MIL/uL (ref 4.40–5.90)
RDW: 12.7 % (ref 11.5–14.5)
WBC: 12.3 10*3/uL — ABNORMAL HIGH (ref 3.8–10.6)

## 2017-05-28 MED ORDER — LEVOFLOXACIN 750 MG PO TABS: 750.0000 mg | ORAL_TABLET | Freq: Every day | ORAL | 0 refills | Status: DC

## 2017-05-28 MED ORDER — NICOTINE 21 MG/24HR TD PT24
21.0000 mg | MEDICATED_PATCH | Freq: Every day | TRANSDERMAL | 0 refills | Status: DC
Start: 2017-05-28 — End: 2020-04-01

## 2017-05-28 MED ORDER — GUAIFENESIN-DM 100-10 MG/5ML PO SYRP: 5.0000 mL | ORAL_SOLUTION | ORAL | 0 refills | Status: DC | PRN

## 2017-05-28 MED ORDER — BECLOMETHASONE DIPROPIONATE 40 MCG/ACT IN AERS: 1.0000 | INHALATION_SPRAY | Freq: Two times a day (BID) | RESPIRATORY_TRACT | 12 refills | Status: DC

## 2017-05-28 MED ORDER — PREDNISONE 10 MG PO TABS: 40.0000 mg | ORAL_TABLET | Freq: Every day | ORAL | 0 refills | Status: AC

## 2017-05-28 NOTE — Care Management Obs Status (Signed)
MEDICARE OBSERVATION STATUS NOTIFICATION   Patient Details  Name: Kyle Kelly MRN: [id number] Date of Birth: 05/27/1996   Medicare Observation Status Notification Given:  No  Discharged in less than 24 hours    Judit Awad A, RN 05/28/2017, 9:53 AM

## 2017-05-28 NOTE — Progress Notes (Signed)
Discharge instructions given and went over with patient at bedside. Prescriptions given and reviewed. All questions answered.   Taxi voucher provided to patient. Awaiting arrival. Bo McclintockBrewer,Daud Cayer S, RN

## 2017-05-28 NOTE — Clinical Social Work Note (Signed)
CSW spoke with the patient about his discharge plan. The patient has decided not to discharge to the homeless shelter. The CSW is signing off. Please consult should additional needs arise.  Argentina PonderKaren Martha Regina Coppolino, MSW, Theresia MajorsLCSWA [phone]

## 2017-05-28 NOTE — Discharge Summary (Signed)
[facility] - [place] at [facility]   PATIENT NAME: Kyle Kelly    MR#:  [id number]  DATE OF BIRTH:  12/19/1995  DATE OF ADMISSION:  05/27/2017 ADMITTING PHYSICIAN: Adrian SaranSital Marthena Whitmyer, MD  DATE OF DISCHARGE: 05/28/2017  PRIMARY CARE PHYSICIAN: Patient, No Pcp Per    ADMISSION DIAGNOSIS:  Bronchospasm [J98.01] Respiratory distress [R06.03] Community acquired pneumonia, unspecified laterality [J18.9]  DISCHARGE DIAGNOSIS:  Active Problems:   Respiratory failure (HCC)   SECONDARY DIAGNOSIS:   Past Medical History:  Diagnosis Date  . Asthma    not recent    HOSPITAL COURSE:   21 year old male who presents with acute hypoxic respiratory failure.  1. Acute hypoxic respiratory failure in the setting of acute bronchitis with bronchospasm Initially was thought that patient may have pneumonia. Follow-up chest x-ray does not show evidence of pneumonia. Patient was evaluated by pulmonary while in the hospital. Pulmonologist felt patient may have asthma exacerbation with acute proctitis. Patient does not have a formal diagnosis asthma and therefore is diagnosed with acute bronchitis with bronchospasm. Patient will have outpatient follow-up with pulmonologist in 2-4 weeks. He will continue with inhaler, steroids and Levaquin for treatment of acute bronchitis. His oxygen has been weaned off and his oxygen saturation is 94% on room air at the time of discharge.  2. Tobacco depends: Patient was counseled against smoking.   DISCHARGE CONDITIONS AND DIET:  Stable regular  CONSULTS OBTAINED:    DRUG ALLERGIES:   Allergies  Allergen Reactions  . Sulfonylureas Anaphylaxis  . Sulfa Antibiotics   . Sulfur     DISCHARGE MEDICATIONS:   Current Discharge Medication List    START taking these medications   Details  beclomethasone (QVAR) 40 MCG/ACT inhaler Inhale 1 puff into the lungs 2 (two) times daily. Qty: 1 Inhaler, Refills: 12    guaiFENesin-dextromethorphan  (ROBITUSSIN DM) 100-10 MG/5ML syrup Take 5 mLs by mouth every 4 (four) hours as needed for cough. Qty: 118 mL, Refills: 0    levofloxacin (LEVAQUIN) 750 MG tablet Take 1 tablet (750 mg total) by mouth daily. Qty: 5 tablet, Refills: 0    nicotine (NICODERM CQ - DOSED IN MG/24 HOURS) 21 mg/24hr patch Place 1 patch (21 mg total) onto the skin daily. Qty: 28 patch, Refills: 0    predniSONE (DELTASONE) 10 MG tablet Take 4 tablets (40 mg total) by mouth daily with breakfast. Qty: 20 tablet, Refills: 0          Today   CHIEF COMPLAINT:  Found bugs in his bed Breathing is improved. He still has cough. No wheezing   VITAL SIGNS:  Blood pressure (!) 142/63, pulse 87, temperature 98.6 F (37 C), temperature source Oral, resp. rate 18, height 6\' 1"  (1.854 m), weight 87.6 kg (193 lb 3.2 oz), SpO2 97 %.   REVIEW OF SYSTEMS:  Review of Systems  Constitutional: Negative.  Negative for chills, fever and malaise/fatigue.  HENT: Negative.  Negative for ear discharge, ear pain, hearing loss, nosebleeds and sore throat.   Eyes: Negative.  Negative for blurred vision and pain.  Respiratory: Positive for cough. Negative for hemoptysis, shortness of breath and wheezing.   Cardiovascular: Negative.  Negative for chest pain, palpitations and leg swelling.  Gastrointestinal: Negative.  Negative for abdominal pain, blood in stool, diarrhea, nausea and vomiting.  Genitourinary: Negative.  Negative for dysuria.  Musculoskeletal: Negative.  Negative for back pain.  Skin: Negative.   Neurological: Negative for dizziness, tremors, speech change, focal weakness, seizures and headaches.  Endo/Heme/Allergies: Negative.  Does not bruise/bleed easily.  Psychiatric/Behavioral: Negative.  Negative for depression, hallucinations and suicidal ideas.     PHYSICAL EXAMINATION:  GENERAL:  21 y.o.-year-old patient lying in the bed with no acute distress.  NECK:  Supple, no jugular venous distention. No thyroid  enlargement, no tenderness.  LUNGS: Normal breath sounds bilaterally, no wheezing, rales,rhonchi  No use of accessory muscles of respiration.  CARDIOVASCULAR: S1, S2 normal. No murmurs, rubs, or gallops.  ABDOMEN: Soft, non-tender, non-distended. Bowel sounds present. No organomegaly or mass.  EXTREMITIES: No pedal edema, cyanosis, or clubbing.  PSYCHIATRIC: The patient is alert and oriented x 3.  SKIN: No obvious rash, lesion, or ulcer.   DATA REVIEW:   CBC  Recent Labs Lab 05/28/17 0511  WBC 12.3*  HGB 14.5  HCT 42.1  PLT 153    Chemistries   Recent Labs Lab 05/28/17 0511  NA 140  K 3.7  CL 107  CO2 25  GLUCOSE 129*  BUN 11  CREATININE 0.97  CALCIUM 9.2    Cardiac Enzymes  Recent Labs Lab 05/27/17 0535  TROPONINI <0.03    Microbiology Results  @MICRORSLT48 @  RADIOLOGY:  Dg Chest 1 View  Result Date: 05/28/2017 CLINICAL DATA:  Pneumonia, shortness of Breath EXAM: CHEST 1 VIEW COMPARISON:  05/27/2017 FINDINGS: Previously seen multifocal airspace opacities by CT not appreciated by plain film. No visible confluent airspace opacity or effusion. Heart is normal size. IMPRESSION: No active disease. Electronically Signed   By: Charlett Nose M.D.   On: 05/28/2017 07:20   Dg Chest 2 View  Result Date: 05/27/2017 CLINICAL DATA:  Two day history of dyspnea and sharp right chest pain. Uncontrollable cough. EXAM: CHEST  2 VIEW COMPARISON:  07/03/2010 FINDINGS: The lungs are clear. The pulmonary vasculature is normal. Heart size is normal. Hilar and mediastinal contours are unremarkable. There is no pleural effusion. IMPRESSION: No active cardiopulmonary disease. Electronically Signed   By: Ellery Plunk M.D.   On: 05/27/2017 03:57   Ct Angio Chest Pe W And/or Wo Contrast  Result Date: 05/27/2017 CLINICAL DATA:  Two days of dyspnea. Uncontrollable coughing, with a positional component. EXAM: CT ANGIOGRAPHY CHEST WITH CONTRAST TECHNIQUE: Multidetector CT imaging of the  chest was performed using the standard protocol during bolus administration of intravenous contrast. Multiplanar CT image reconstructions and MIPs were obtained to evaluate the vascular anatomy. CONTRAST:  100 mL Isovue 370 intravenous COMPARISON:  Radiographs 05/27/2017 FINDINGS: Cardiovascular: Satisfactory opacification of the pulmonary arteries to the segmental level. No evidence of pulmonary embolism. Normal heart size. No pericardial effusion. Mediastinum/Nodes: No enlarged mediastinal, hilar, or axillary lymph nodes. Thyroid gland, trachea, and esophagus demonstrate no significant findings. Lungs/Pleura: Scattered patchy and nodular airspace opacities involving upper lobes and lower lobes bilaterally. This may represent multifocal pneumonia. Inflammatory processes are also possible. Airways are patent and normal in caliber.  No pleural effusions. Upper Abdomen: No significant abnormality. Musculoskeletal: No significant skeletal abnormality. Review of the MIP images confirms the above findings. IMPRESSION: Negative for acute pulmonary embolism. Multifocal patchy airspace opacities bilaterally may represent pneumonia. Electronically Signed   By: Ellery Plunk M.D.   On: 05/27/2017 06:53      Current Discharge Medication List    START taking these medications   Details  beclomethasone (QVAR) 40 MCG/ACT inhaler Inhale 1 puff into the lungs 2 (two) times daily. Qty: 1 Inhaler, Refills: 12    guaiFENesin-dextromethorphan (ROBITUSSIN DM) 100-10 MG/5ML syrup Take 5 mLs by mouth every 4 (four) hours  as needed for cough. Qty: 118 mL, Refills: 0    levofloxacin (LEVAQUIN) 750 MG tablet Take 1 tablet (750 mg total) by mouth daily. Qty: 5 tablet, Refills: 0    nicotine (NICODERM CQ - DOSED IN MG/24 HOURS) 21 mg/24hr patch Place 1 patch (21 mg total) onto the skin daily. Qty: 28 patch, Refills: 0    predniSONE (DELTASONE) 10 MG tablet Take 4 tablets (40 mg total) by mouth daily with  breakfast. Qty: 20 tablet, Refills: 0          Management plans discussed with the patient and he is in agreement. Stable for discharge home  Patient should follow up with dr Nicholos Johnsramachandran  CODE STATUS:     Code Status Orders        Start     Ordered   05/27/17 0904  Full code  Continuous     05/27/17 0903    Code Status History    Date Active Date Inactive Code Status Order ID Comments User Context   This patient has a current code status but no historical code status.      TOTAL TIME TAKING CARE OF THIS PATIENT: 38 minutes.    Note: This dictation was prepared with Dragon dictation along with smaller phrase technology. Any transcriptional errors that result from this process are unintentional.  Deya Bigos M.D on 05/28/2017 at 7:31 AM  Between 7am to 6pm - Pager - [phone] After 6pm go to [email or web address] - password [email or web address]  [facility] [place] [facility]  Office  [phone]  CC: Primary care physician; Patient, No Pcp Per

## 2017-05-28 NOTE — Progress Notes (Signed)
[place] via [place]. Bo McclintockBrewer,Paymon Rosensteel S, RN

## 2017-05-28 NOTE — Care Management Note (Signed)
Case Management Note  Patient Details  Name: Kyle PaganiniZavier J Kelly MRN: [id number] Date of Birth: 03/07/1996  Subjective/Objective:        Mr Kyle Kelly is being discharged to the local homeless shelter today. He has Medicaid. Call to Dr Juliene PinaMody per discharge order for QVAR which is not Medicaid formulary. [facility] pharmacy is recommending Budesonide which is on the Medicaid formulary.             Action/Plan:   Expected Discharge Date:  05/28/17               Expected Discharge Plan:   05/28/17  In-House Referral:     Discharge planning Services   Has Medicaid for meds. [facility].   Post Acute Care Choice:    Choice offered to:     DME Arranged:    DME Agency:     HH Arranged:    HH Agency:     Status of Service:   Completed.  If discussed at Long Length of Stay Meetings, dates discussed:    Additional Comments:  Chastidy Ranker A, RN 05/28/2017, 9:11 AM

## 2017-06-01 LAB — CULTURE, BLOOD (ROUTINE X 2)
CULTURE: NO GROWTH
CULTURE: NO GROWTH
SPECIAL REQUESTS: ADEQUATE

## 2017-08-21 ENCOUNTER — Encounter (HOSPITAL_COMMUNITY): Payer: Self-pay | Admitting: Emergency Medicine

## 2017-08-21 DIAGNOSIS — N489 Disorder of penis, unspecified: Secondary | ICD-10-CM | POA: Insufficient documentation

## 2017-08-21 DIAGNOSIS — F17210 Nicotine dependence, cigarettes, uncomplicated: Secondary | ICD-10-CM | POA: Insufficient documentation

## 2017-08-21 DIAGNOSIS — K0889 Other specified disorders of teeth and supporting structures: Secondary | ICD-10-CM | POA: Insufficient documentation

## 2017-08-21 DIAGNOSIS — J45909 Unspecified asthma, uncomplicated: Secondary | ICD-10-CM | POA: Insufficient documentation

## 2017-08-21 DIAGNOSIS — Z79899 Other long term (current) drug therapy: Secondary | ICD-10-CM | POA: Insufficient documentation

## 2017-08-21 NOTE — ED Triage Notes (Signed)
Patient reports skin "bump" at right side of penis with intermittent drainage for several months , denies injury , no dysuria or fever .

## 2017-08-22 ENCOUNTER — Emergency Department (HOSPITAL_COMMUNITY)
Admission: EM | Admit: 2017-08-22 | Discharge: 2017-08-22 | Disposition: A | Discharge status: Home / Self Care | Payer: Self-pay | Attending: Physician Assistant | Admitting: Physician Assistant

## 2017-08-22 DIAGNOSIS — N489 Disorder of penis, unspecified: Secondary | ICD-10-CM

## 2017-08-22 DIAGNOSIS — K0889 Other specified disorders of teeth and supporting structures: Secondary | ICD-10-CM

## 2017-08-22 LAB — RPR: RPR Ser Ql: NONREACTIVE

## 2017-08-22 MED ORDER — IBUPROFEN 800 MG PO TABS: 800.0000 mg | ORAL_TABLET | Freq: Three times a day (TID) | ORAL | 0 refills | Status: DC | PRN

## 2017-08-22 MED ORDER — PENICILLIN V POTASSIUM 500 MG PO TABS: 500.0000 mg | ORAL_TABLET | Freq: Four times a day (QID) | ORAL | 0 refills | Status: DC

## 2017-08-22 NOTE — ED Notes (Signed)
C/o toothache for several weeks states he has a hole in his tooth. Also c/o "red bump" on penis x 4 weeks. States its getting bigger.

## 2017-08-22 NOTE — ED Provider Notes (Signed)
[facility] Provider Note   CSN: [id number] Arrival date & time: 08/21/17  2317     History   Chief Complaint Chief Complaint  Patient presents with  . Penile Problem    HPI Kyle Kelly is a 21 y.o. male.  HPI Patient presents to the emergency department with a bump on the right side of his penis and dental pain.  The patient states that the area on his penis started 4 weeks ago and the dental pain started 1 week ago patient states that he does have problems out of that tooth before.  The patient states that the area on his penis is occurred before and seems to happen in the same area and at the same time of the year in the fall/winter.  Patient states that the area on his penis does not cause any pain he also states that he has not had any drainage from the area.  The patient states that nothing seems to make the condition better or worse.  Patient did not take any medications prior to arrival.  Patient denies any penile discharge, fever, nausea, vomiting, dysuria, incontinence, or syncope Past Medical History:  Diagnosis Date  . Asthma    not recent    Patient Active Problem List   Diagnosis Date Noted  . Acute bronchitis 05/28/2017  . Respiratory failure (HCC) 05/27/2017    History reviewed. No pertinent surgical history.     Home Medications    Prior to Admission medications   Medication Sig Start Date End Date Taking? Authorizing Provider  beclomethasone (QVAR) 40 MCG/ACT inhaler Inhale 1 puff into the lungs 2 (two) times daily. 05/28/17   Adrian SaranMody, Sital, MD  guaiFENesin-dextromethorphan (ROBITUSSIN DM) 100-10 MG/5ML syrup Take 5 mLs by mouth every 4 (four) hours as needed for cough. 05/28/17   Adrian SaranMody, Sital, MD  ibuprofen (ADVIL,MOTRIN) 800 MG tablet Take 1 tablet (800 mg total) by mouth every 8 (eight) hours as needed. 08/22/17   Belicia Difatta, Cristal Deerhristopher, PA-C  levofloxacin (LEVAQUIN) 750 MG tablet Take 1 tablet (750 mg total) by  mouth daily. 05/28/17   Adrian SaranMody, Sital, MD  nicotine (NICODERM CQ - DOSED IN MG/24 HOURS) 21 mg/24hr patch Place 1 patch (21 mg total) onto the skin daily. 05/28/17   Adrian SaranMody, Sital, MD  penicillin v potassium (VEETID) 500 MG tablet Take 1 tablet (500 mg total) by mouth 4 (four) times daily. 08/22/17   Misaki Sozio, Cristal Deerhristopher, PA-C    Family History No family history on file.  Social History Social History  Substance Use Topics  . Smoking status: Current Every Day Smoker    Packs/day: 0.50    Years: 4.00    Types: Cigarettes  . Smokeless tobacco: Never Used  . Alcohol use No     Allergies   Sulfonylureas; Sulfa antibiotics; and Sulfur   Review of Systems Review of Systems All other systems negative except as documented in the HPI. All pertinent positives and negatives as reviewed in the HPI.  Physical Exam Updated Vital Signs BP (!) 181/65 (BP Location: Right Arm)   Pulse 67   Temp 98.3 F (36.8 C) (Oral)   Resp 18   Ht 6\' 1"  (1.854 m)   Wt 93.4 kg (206 lb)   SpO2 99%   BMI 27.18 kg/m   Physical Exam  Constitutional: He is oriented to person, place, and time. He appears well-developed and well-nourished. No distress.  HENT:  Head: Normocephalic and atraumatic.  Mouth/Throat:    Eyes:  Pupils are equal, round, and reactive to light.  Pulmonary/Chest: Effort normal.  Genitourinary:     Neurological: He is alert and oriented to person, place, and time.  Skin: Skin is warm and dry.  Psychiatric: He has a normal mood and affect.  Nursing note and vitals reviewed.    ED Treatments / Results  Labs (all labs ordered are listed, but only abnormal results are displayed) Labs Reviewed  RPR    EKG  EKG Interpretation None       Radiology No results found.  Procedures Procedures (including critical care time)  Medications Ordered in ED Medications - No data to display   Initial Impression / Assessment and Plan / ED Course  I have reviewed the triage vital  signs and the nursing notes.  Pertinent labs & imaging results that were available during my care of the patient were reviewed by me and considered in my medical decision making (see chart for details).     Advised the patient that we will check for syphilis as a possible cause of the swelling also follow-up with dermatology for further evaluation of the area.  The patient states agrees to the plan and all questions were answered.  The patient does have an appointment with his dentist told to return here for any worsening in his condition it is unclear at this point with the cause of the swelling is and it may need further evaluation as an outpatient.   Final Clinical Impressions(s) / ED Diagnoses   Final diagnoses:  Penile lesion  Toothache    New Prescriptions Discharge Medication List as of 08/22/2017  8:15 AM    START taking these medications   Details  ibuprofen (ADVIL,MOTRIN) 800 MG tablet Take 1 tablet (800 mg total) by mouth every 8 (eight) hours as needed., Starting Tue 08/22/2017, Print    penicillin v potassium (VEETID) 500 MG tablet Take 1 tablet (500 mg total) by mouth 4 (four) times daily., Starting Tue 08/22/2017, Print         Yesli Vanderhoff, [place], PA-C 08/22/17 1546    Abelino Derrick, MD 08/22/17 1550

## 2017-08-22 NOTE — Discharge Instructions (Signed)
Return here as needed. Follow up with your dentist.  °

## 2018-08-10 ENCOUNTER — Encounter (HOSPITAL_COMMUNITY): Payer: Self-pay | Admitting: Emergency Medicine

## 2018-08-10 ENCOUNTER — Emergency Department (HOSPITAL_COMMUNITY): Payer: Self-pay

## 2018-08-10 ENCOUNTER — Other Ambulatory Visit: Payer: Self-pay

## 2018-08-10 ENCOUNTER — Emergency Department (HOSPITAL_COMMUNITY)
Admission: EM | Admit: 2018-08-10 | Discharge: 2018-08-10 | Disposition: A | Discharge status: Home / Self Care | Payer: Self-pay | Attending: Emergency Medicine | Admitting: Emergency Medicine

## 2018-08-10 DIAGNOSIS — Z79899 Other long term (current) drug therapy: Secondary | ICD-10-CM | POA: Insufficient documentation

## 2018-08-10 DIAGNOSIS — J45909 Unspecified asthma, uncomplicated: Secondary | ICD-10-CM | POA: Insufficient documentation

## 2018-08-10 DIAGNOSIS — F17210 Nicotine dependence, cigarettes, uncomplicated: Secondary | ICD-10-CM | POA: Insufficient documentation

## 2018-08-10 DIAGNOSIS — J069 Acute upper respiratory infection, unspecified: Secondary | ICD-10-CM | POA: Insufficient documentation

## 2018-08-10 MED ORDER — GUAIFENESIN-CODEINE 100-10 MG/5ML PO SOLN: 10.0000 mL | Freq: Four times a day (QID) | ORAL | 0 refills | Status: AC | PRN

## 2018-08-10 NOTE — Discharge Instructions (Addendum)
Your evaluated today for cough.  Your chest x-ray was negative for pneumonia or other reasons which could be causing her cough.  Your cough is most likely from a viral upper respiratory infection.  I would recommend following with your primary care provider if your cough does not resolve.  Return to the emergency department for any new or worsening symptoms.

## 2018-08-10 NOTE — ED Triage Notes (Signed)
Patient arrived by self from home. Pt c/o of cough and trouble breathing starting a week ago. Pt states they had a fever this past week. Pt has no other issues at this time.

## 2018-08-10 NOTE — ED Provider Notes (Signed)
[facility]-EMERGENCY DEPT Provider Note   CSN: [id number] Arrival date & time: 08/10/18  1425  History   Chief Complaint Chief Complaint  Patient presents with  . Cough    HPI Kyle Kelly is a 22 y.o. male with a past medical history significant for asthma who presents for evaluation of cough x4 days.  Patient states his cough is been productive of a light yellow mucus.  States he has felt warm over the last few days however has not taken his temperature.  Admits to nasal congestion and nasal pressure.  Denies chills, hemoptysis, chest pain, shortness of breath, nausea, vomiting, diarrhea.  Denies any sick contacts.  Denies aggravating or alleviating factors.  HPI  Past Medical History:  Diagnosis Date  . Asthma    not recent    Patient Active Problem List   Diagnosis Date Noted  . Acute bronchitis 05/28/2017  . Respiratory failure (HCC) 05/27/2017    History reviewed. No pertinent surgical history.      Home Medications    Prior to Admission medications   Medication Sig Start Date End Date Taking? Authorizing Provider  beclomethasone (QVAR) 40 MCG/ACT inhaler Inhale 1 puff into the lungs 2 (two) times daily. 05/28/17   Adrian Saran, MD  guaiFENesin-codeine 100-10 MG/5ML syrup Take 10 mLs by mouth every 6 (six) hours as needed for up to 3 days for cough. 08/10/18 08/13/18  Sandhya Denherder A, PA-C  guaiFENesin-dextromethorphan (ROBITUSSIN DM) 100-10 MG/5ML syrup Take 5 mLs by mouth every 4 (four) hours as needed for cough. 05/28/17   Adrian Saran, MD  ibuprofen (ADVIL,MOTRIN) 800 MG tablet Take 1 tablet (800 mg total) by mouth every 8 (eight) hours as needed. 08/22/17   Lawyer, Cristal Deer, PA-C  levofloxacin (LEVAQUIN) 750 MG tablet Take 1 tablet (750 mg total) by mouth daily. 05/28/17   Adrian Saran, MD  nicotine (NICODERM CQ - DOSED IN MG/24 HOURS) 21 mg/24hr patch Place 1 patch (21 mg total) onto the skin daily. 05/28/17   Adrian Saran, MD    penicillin v potassium (VEETID) 500 MG tablet Take 1 tablet (500 mg total) by mouth 4 (four) times daily. 08/22/17   Lawyer, Cristal Deer, PA-C    Family History No family history on file.  Social History Social History   Tobacco Use  . Smoking status: Current Every Day Smoker    Packs/day: 0.50    Years: 4.00    Pack years: 2.00    Types: Cigarettes  . Smokeless tobacco: Never Used  Substance Use Topics  . Alcohol use: No  . Drug use: No     Allergies   Sulfonylureas; Sulfa antibiotics; and Sulfur   Review of Systems Review of Systems  Constitutional: Negative for activity change, appetite change, chills, diaphoresis and fatigue.       Subjective fever  HENT: Positive for congestion, postnasal drip, rhinorrhea, sinus pressure and sinus pain. Negative for drooling, ear discharge, ear pain, facial swelling, nosebleeds, sneezing, sore throat, tinnitus, trouble swallowing and voice change.   Respiratory: Positive for cough. Negative for choking, chest tightness, shortness of breath, wheezing and stridor.   Cardiovascular: Negative for chest pain and palpitations.  Gastrointestinal: Negative for abdominal distention, abdominal pain, diarrhea, nausea and vomiting.     Physical Exam Updated Vital Signs BP (!) 141/81   Pulse (!) 101   Temp 98.2 F (36.8 C) (Oral)   Resp 16   Ht 6\' 2"  (1.88 m)   Wt 89.8 kg   SpO2 95%  BMI 25.42 kg/m   Physical Exam  Constitutional: He appears well-developed and well-nourished. No distress.  HENT:  Head: Atraumatic.  Right Ear: Tympanic membrane, external ear and ear canal normal. Tympanic membrane is not erythematous, not retracted and not bulging.  Left Ear: Tympanic membrane, external ear and ear canal normal. Tympanic membrane is not erythematous, not retracted and not bulging.  Nose: Mucosal edema, rhinorrhea and sinus tenderness present. No nose lacerations, nasal deformity, septal deviation or nasal septal hematoma. No  epistaxis.  No foreign bodies. Right sinus exhibits no maxillary sinus tenderness and no frontal sinus tenderness. Left sinus exhibits maxillary sinus tenderness. Left sinus exhibits no frontal sinus tenderness.  Mouth/Throat: Uvula is midline and mucous membranes are normal. No oral lesions. No trismus in the jaw. No dental abscesses or uvula swelling. Posterior oropharyngeal erythema present. No oropharyngeal exudate, posterior oropharyngeal edema or tonsillar abscesses. No tonsillar exudate.  Eyes: Pupils are equal, round, and reactive to light.  Neck: Normal range of motion. Neck supple.  Cardiovascular: Normal rate, regular rhythm and normal heart sounds.  Pulmonary/Chest: Effort normal and breath sounds normal. No stridor. No tachypnea. No respiratory distress. He has no decreased breath sounds. He has no wheezes. He has no rhonchi. He has no rales. He exhibits no tenderness.  Abdominal: Soft. He exhibits no distension.  Musculoskeletal: Normal range of motion.  Neurological: He is alert.  Skin: Skin is warm and dry. He is not diaphoretic.  Psychiatric: He has a normal mood and affect.  Nursing note and vitals reviewed.    ED Treatments / Results  Labs (all labs ordered are listed, but only abnormal results are displayed) Labs Reviewed - No data to display  EKG None  Radiology Dg Chest 2 View  Result Date: 08/10/2018 CLINICAL DATA:  Cough and difficulty breathing starting a week ago. Fever this past week. History of asthma, smoking. EXAM: CHEST - 2 VIEW COMPARISON:  Chest x-rays dated 05/28/2017 and 05/27/2017. FINDINGS: Heart size and mediastinal contours are within normal limits. Lungs are clear. Lung volumes are within normal limits. No pleural effusion or pneumothorax. Osseous structures about the chest are unremarkable. IMPRESSION: No active cardiopulmonary disease. No evidence of pneumonia or pulmonary edema. Electronically Signed   By: Bary Richard M.D.   On: 08/10/2018 15:57     Procedures Procedures (including critical care time)  Medications Ordered in ED Medications - No data to display   Initial Impression / Assessment and Plan / ED Course  I have reviewed the triage vital signs and the nursing notes.  Pertinent labs & imaging results that were available during my care of the patient were reviewed by me and considered in my medical decision making (see chart for details).  22 year old otherwise well-appearing male presents for evaluation of intermittent cough x4 days.  Patient is concerned as he was admitted with pneumonia around this time last year.  Afebrile, nonseptic, non-ill-appearing.  Lungs clear to auscultation.  He does not appear in any acute distress, is able to speak in full sentences without difficulty.  No evidence of hypoxia during visit.  Given patient's concern will obtain chest x-ray.  Chest x-ray negative for infiltrates, pulmonary edema, CHF.  Patient's cough is most likely due to viral upper respiratory infection.  Discussed with patient symptomatic treatment.  Discussed with patient reasons to return to the emergency department.  Patient voiced understanding is agreeable for return.    Final Clinical Impressions(s) / ED Diagnoses   Final diagnoses:  Viral upper respiratory  tract infection    ED Discharge Orders         Ordered    guaiFENesin-codeine 100-10 MG/5ML syrup  Every 6 hours PRN     08/10/18 1610           Bernetta Sutley A, PA-C 08/10/18 1613    Terrilee Files, MD 08/11/18 1104

## 2018-08-10 NOTE — ED Notes (Signed)
Patient given discharge teaching and verbalized understanding. Patient ambulated out of ED with a steady gait. 

## 2018-10-21 ENCOUNTER — Ambulatory Visit (HOSPITAL_COMMUNITY)
Admission: EM | Admit: 2018-10-21 | Discharge: 2018-10-21 | Disposition: A | Discharge status: Home / Self Care | Payer: Self-pay | Attending: Internal Medicine | Admitting: Internal Medicine

## 2018-10-21 ENCOUNTER — Encounter (HOSPITAL_COMMUNITY): Payer: Self-pay | Admitting: Emergency Medicine

## 2018-10-21 DIAGNOSIS — J111 Influenza due to unidentified influenza virus with other respiratory manifestations: Secondary | ICD-10-CM

## 2018-10-21 DIAGNOSIS — R509 Fever, unspecified: Secondary | ICD-10-CM

## 2018-10-21 DIAGNOSIS — R05 Cough: Secondary | ICD-10-CM

## 2018-10-21 DIAGNOSIS — J40 Bronchitis, not specified as acute or chronic: Secondary | ICD-10-CM | POA: Insufficient documentation

## 2018-10-21 DIAGNOSIS — R69 Illness, unspecified: Secondary | ICD-10-CM | POA: Insufficient documentation

## 2018-10-21 DIAGNOSIS — F17210 Nicotine dependence, cigarettes, uncomplicated: Secondary | ICD-10-CM

## 2018-10-21 DIAGNOSIS — R51 Headache: Secondary | ICD-10-CM

## 2018-10-21 MED ORDER — ALBUTEROL SULFATE (2.5 MG/3ML) 0.083% IN NEBU
INHALATION_SOLUTION | RESPIRATORY_TRACT | Status: AC
  Filled 2018-10-21: qty 3

## 2018-10-21 MED ORDER — ALBUTEROL SULFATE (2.5 MG/3ML) 0.083% IN NEBU
2.5000 mg | INHALATION_SOLUTION | Freq: Once | RESPIRATORY_TRACT | Status: AC
  Administered 2018-10-21: 2.5 mg via RESPIRATORY_TRACT

## 2018-10-21 MED ORDER — ACETAMINOPHEN 325 MG PO TABS
ORAL_TABLET | ORAL | Status: AC
  Filled 2018-10-21: qty 2

## 2018-10-21 MED ORDER — ACETAMINOPHEN 160 MG/5ML PO SOLN
ORAL | Status: AC
  Filled 2018-10-21: qty 20.3

## 2018-10-21 MED ORDER — ACETAMINOPHEN 325 MG PO TABS
650.0000 mg | ORAL_TABLET | Freq: Once | ORAL | Status: AC
  Administered 2018-10-21: 650 mg via ORAL

## 2018-10-21 NOTE — ED Provider Notes (Signed)
[facility]    CSN: [id number] Arrival date & time: 10/21/18  1723     History   Chief Complaint Chief Complaint  Patient presents with  . Cough    HPI Kyle PaganiniZavier J Kelly is a 22 y.o. male.   Who has been having a runny nose 4 days ago, and developed a HA 2 days ago and low grade temp. He is also coughing and makes his HA worse. Cough has been non productive. He is a smoker. Has not been sick in the past month. Has been around someone who was placed on an inhaler and he does work for [employer].     Past Medical History:  Diagnosis Date  . Asthma    not recent    Patient Active Problem List   Diagnosis Date Noted  . Acute bronchitis 05/28/2017  . Respiratory failure (HCC) 05/27/2017    History reviewed. No pertinent surgical history.     Home Medications    Prior to Admission medications   Medication Sig Start Date End Date Taking? Authorizing Provider  beclomethasone (QVAR) 40 MCG/ACT inhaler Inhale 1 puff into the lungs 2 (two) times daily. 05/28/17   Adrian SaranMody, Sital, MD  guaiFENesin-dextromethorphan (ROBITUSSIN DM) 100-10 MG/5ML syrup Take 5 mLs by mouth every 4 (four) hours as needed for cough. 05/28/17   Adrian SaranMody, Sital, MD  ibuprofen (ADVIL,MOTRIN) 800 MG tablet Take 1 tablet (800 mg total) by mouth every 8 (eight) hours as needed. 08/22/17   Lawyer, Cristal Deerhristopher, PA-C  nicotine (NICODERM CQ - DOSED IN MG/24 HOURS) 21 mg/24hr patch Place 1 patch (21 mg total) onto the skin daily. 05/28/17   Adrian SaranMody, Sital, MD    Family History History reviewed. No pertinent family history.  Social History Social History   Tobacco Use  . Smoking status: Current Every Day Smoker    Packs/day: 0.50    Years: 4.00    Pack years: 2.00    Types: Cigarettes  . Smokeless tobacco: Never Used  Substance Use Topics  . Alcohol use: No  . Drug use: No     Allergies   Sulfonylureas; Sulfa antibiotics; and Sulfur   Review of Systems Review of Systems  Constitutional:  Positive for chills and fever. Negative for activity change, appetite change and diaphoresis.  HENT: Positive for congestion, postnasal drip and rhinorrhea. Negative for ear discharge, ear pain, sinus pressure, sinus pain, sore throat and trouble swallowing.   Eyes: Negative for discharge.  Respiratory: Positive for cough and wheezing. Negative for chest tightness and shortness of breath.   Cardiovascular: Negative for chest pain.  Gastrointestinal: Negative for diarrhea, nausea and vomiting.  Musculoskeletal: Negative for myalgias.  Skin: Negative for rash.  Neurological: Positive for headaches. Negative for facial asymmetry.  Post duo neb his lungs were clear.  Physical Exam Triage Vital Signs ED Triage Vitals [10/21/18 1811]  Enc Vitals Group     BP 137/73     Pulse Rate 82     Resp 18     Temp (!) 100.5 F (38.1 C)     Temp Source Oral     SpO2 100 %     Weight      Height      Head Circumference      Peak Flow      Pain Score 7     Pain Loc      Pain Edu?      Excl. in GC?    No data found.  Updated Vital  Signs BP 137/73 (BP Location: Right Arm)   Pulse 82   Temp (!) 100.5 F (38.1 C) (Oral)   Resp 18   SpO2 100%   Visual Acuity Right Eye Distance:   Left Eye Distance:   Bilateral Distance:    Right Eye Near:   Left Eye Near:    Bilateral Near:     Physical Exam Constitutional:      General: He is not in acute distress.    Appearance: Normal appearance. He is not toxic-appearing.  HENT:     Head: Normocephalic.     Right Ear: Tympanic membrane, ear canal and external ear normal.     Left Ear: Tympanic membrane, ear canal and external ear normal.     Nose: Congestion and rhinorrhea present.     Mouth/Throat:     Mouth: Mucous membranes are moist.     Pharynx: Oropharynx is clear. No oropharyngeal exudate or posterior oropharyngeal erythema.  Eyes:     General: No scleral icterus.       Right eye: No discharge.        Left eye: No discharge.      Extraocular Movements: Extraocular movements intact.     Conjunctiva/sclera: Conjunctivae normal.     Pupils: Pupils are equal, round, and reactive to light.  Neck:     Musculoskeletal: Neck supple. No neck rigidity.  Cardiovascular:     Rate and Rhythm: Normal rate and regular rhythm.     Heart sounds: No murmur.  Pulmonary:     Effort: Pulmonary effort is normal. No respiratory distress.     Breath sounds: Wheezing and rales present. No rhonchi.     Comments: Has a mucosy wheezy cough, and has mild wheezing with expiration Musculoskeletal: Normal range of motion.  Lymphadenopathy:     Cervical: No cervical adenopathy.  Skin:    General: Skin is warm and dry.  Neurological:     Mental Status: He is alert and oriented to person, place, and time.     Motor: No weakness.     Gait: Gait normal.  Psychiatric:        Mood and Affect: Mood normal.        Behavior: Behavior normal.        Thought Content: Thought content normal.        Judgment: Judgment normal.    UC Treatments / Results  Labs (all labs ordered are listed, but only abnormal results are displayed) Labs Reviewed - No data to display  EKG None  Radiology No results found.  Procedures Procedures   Medications Ordered in UC Medications  acetaminophen (TYLENOL) tablet 650 mg (650 mg Oral Given 10/21/18 1835)  albuterol (PROVENTIL) (2.5 MG/3ML) 0.083% nebulizer solution 2.5 mg (2.5 mg Nebulization Given 10/21/18 1835)   Initial Impression / Assessment and Plan / UC Course  I have reviewed the triage vital signs and the nursing notes. I suspect he is getting influenza and possibly bronchitis. I placed him on Tamiflu and zpack as noted.  Needs to quit smoking.  Fu  If he gets worse.  Work note given to be off til [employer]. Cant go  Back til he has not had a fever for 24h.  Final Clinical Impressions(s) / UC Diagnoses   Final diagnoses:  Bronchitis  Influenza-like illness     Discharge Instructions     WORK  ON QUIT SMOKING  I AM PLACING YOU ON TAMIFLU FOR POSSIBLY FLU, AND ZPACK TO COVER BRONCHITIS  YOU  MAY TAKE DAYQUIL OR NYQUIL TO HELP YOUR SYMPTOMS.     ED Prescriptions    None     Controlled Substance Prescriptions [place] Controlled Substance Registry consulted?    Garey HamRodriguez-Southworth, Aldrin Engelhard, [place]-C 10/21/18 1851

## 2018-10-21 NOTE — Discharge Instructions (Signed)
WORK ON QUIT SMOKING  I AM PLACING YOU ON TAMIFLU FOR POSSIBLY FLU, AND ZPACK TO COVER BRONCHITIS  YOU MAY TAKE DAYQUIL OR NYQUIL TO HELP YOUR SYMPTOMS.

## 2018-10-21 NOTE — ED Triage Notes (Signed)
Pt sts cough and body aches x 2 days  

## 2019-08-19 ENCOUNTER — Other Ambulatory Visit: Payer: Self-pay

## 2019-08-19 ENCOUNTER — Ambulatory Visit
Admission: EM | Admit: 2019-08-19 | Discharge: 2019-08-19 | Disposition: A | Discharge status: Home / Self Care | Payer: Self-pay | Attending: Emergency Medicine | Admitting: Emergency Medicine

## 2019-08-19 DIAGNOSIS — K029 Dental caries, unspecified: Secondary | ICD-10-CM

## 2019-08-19 DIAGNOSIS — K047 Periapical abscess without sinus: Secondary | ICD-10-CM

## 2019-08-19 MED ORDER — AMOXICILLIN 500 MG PO CAPS: 500.0000 mg | ORAL_CAPSULE | Freq: Two times a day (BID) | ORAL | 0 refills | Status: AC

## 2019-08-19 MED ORDER — MELOXICAM 15 MG PO TABS: 15.0000 mg | ORAL_TABLET | Freq: Every day | ORAL | 0 refills | Status: DC

## 2019-08-19 NOTE — ED Provider Notes (Signed)
[name]    CSN: [id number] Arrival date & time: 08/19/19  1645      History   Chief Complaint Chief Complaint  Patient presents with  . Dental Pain    2X WKS    HPI Kyle Kelly is a 23 y.o. male presenting for left lower rear molar tooth pain for the last 2 weeks.  Patient states he has a hole in his tooth; endorsing temperature sensitivity, pain with chewing.  Patient has been taking Tylenol, ibuprofen with minimal relief of pain at this point.  Patient denies dental abscess, retropharyngeal abscess.  No fever, ear/eyes/nose pain.  Does not currently have a dentist.   Past Medical History:  Diagnosis Date  . Asthma    not recent    Patient Active Problem List   Diagnosis Date Noted  . Acute bronchitis 05/28/2017  . Respiratory failure (HCC) 05/27/2017    History reviewed. No pertinent surgical history.     Home Medications    Prior to Admission medications   Medication Sig Start Date End Date Taking? Authorizing Provider  amoxicillin (AMOXIL) 500 MG capsule Take 1 capsule (500 mg total) by mouth 2 (two) times daily for 7 days. 08/19/19 08/26/19  Hall-Potvin, [place], PA-C  beclomethasone (QVAR) 40 MCG/ACT inhaler Inhale 1 puff into the lungs 2 (two) times daily. 05/28/17   Adrian SaranMody, Sital, MD  guaiFENesin-dextromethorphan (ROBITUSSIN DM) 100-10 MG/5ML syrup Take 5 mLs by mouth every 4 (four) hours as needed for cough. 05/28/17   Adrian SaranMody, Sital, MD  meloxicam (MOBIC) 15 MG tablet Take 1 tablet (15 mg total) by mouth daily. 08/19/19   Hall-Potvin, [place], PA-C  nicotine (NICODERM CQ - DOSED IN MG/24 HOURS) 21 mg/24hr patch Place 1 patch (21 mg total) onto the skin daily. 05/28/17   Adrian SaranMody, Sital, MD    Family History Family History  Problem Relation Age of Onset  . Asthma Mother   . Healthy Father     Social History Social History   Tobacco Use  . Smoking status: Current Every Day Smoker    Packs/day: 0.50    Years: 4.00    Pack years: 2.00     Types: Cigarettes  . Smokeless tobacco: Never Used  Substance Use Topics  . Alcohol use: No  . Drug use: No     Allergies   Sulfonylureas, Sulfa antibiotics, and Sulfur   Review of Systems Review of Systems  Constitutional: Negative for fatigue and fever.  HENT: Positive for dental problem. Negative for congestion, drooling, ear pain, facial swelling, hearing loss, sinus pain, sore throat, trouble swallowing and voice change.   Eyes: Negative for photophobia, pain and visual disturbance.  Respiratory: Negative for cough and shortness of breath.   Cardiovascular: Negative for chest pain and palpitations.  Gastrointestinal: Negative for diarrhea and vomiting.  Musculoskeletal: Negative for arthralgias and myalgias.  Neurological: Negative for dizziness and headaches.     Physical Exam Triage Vital Signs ED Triage Vitals  Enc Vitals Group     BP      Pulse      Resp      Temp      Temp src      SpO2      Weight      Height      Head Circumference      Peak Flow      Pain Score      Pain Loc      Pain Edu?  Excl. in [place]?    No data found.  Updated Vital Signs BP 122/81 (BP Location: Left Arm)   Pulse 65   Temp 98.1 F (36.7 C) (Oral)   Wt 210 lb (95.3 kg)   SpO2 98%   BMI 26.96 kg/m   Visual Acuity Right Eye Distance:   Left Eye Distance:   Bilateral Distance:    Right Eye Near:   Left Eye Near:    Bilateral Near:     Physical Exam Constitutional:      General: He is not in acute distress.    Appearance: He is not ill-appearing.  HENT:     Head: Normocephalic and atraumatic.     Right Ear: Tympanic membrane, ear canal and external ear normal.     Left Ear: Tympanic membrane, ear canal and external ear normal.     Nose: No nasal deformity, congestion or rhinorrhea.     Comments: Turbinates nonedematous bilaterally with pink mucosa    Mouth/Throat:     Mouth: Mucous membranes are moist.     Tongue: Tongue does not deviate from midline.      Pharynx: Oropharynx is clear. Uvula midline.     Comments: No tonsillar hypertrophy or exudate.  Poor dentition with large cavity extending down to gumline in the left lower rear molar.  No gingival edema, erythema, fluctuance.  Tooth and surrounding gumline TTP.  No active discharge Eyes:     General: No scleral icterus.    Extraocular Movements: Extraocular movements intact.     Conjunctiva/sclera: Conjunctivae normal.     Pupils: Pupils are equal, round, and reactive to light.  Neck:     Musculoskeletal: Normal range of motion and neck supple. No muscular tenderness.  Cardiovascular:     Rate and Rhythm: Normal rate.  Pulmonary:     Effort: Pulmonary effort is normal. No respiratory distress.     Breath sounds: No wheezing.  Lymphadenopathy:     Cervical: No cervical adenopathy.  Neurological:     Mental Status: He is alert.      UC Treatments / Results  Labs (all labs ordered are listed, but only abnormal results are displayed) Labs Reviewed - No data to display  EKG   Radiology No results found.  Procedures Procedures (including critical care time)  Medications Ordered in UC Medications - No data to display  Initial Impression / Assessment and Plan / UC Course  I have reviewed the triage vital signs and the nursing notes.  Pertinent labs & imaging results that were available during my care of the patient were reviewed by me and considered in my medical decision making (see chart for details).     We will start amoxicillin today.  Increase pain management to meloxicam as needed.  Reviewed that antibiotic will be most helpful with pain given dental carry likely infected/high risk infection.  Patient given community resources for dental offices with instruction to follow-up in 1 to 2 weeks.  Return precautions discussed, patient verbalized understanding and is agreeable to plan. Final Clinical Impressions(s) / UC Diagnoses   Final diagnoses:  Infected dental caries      Discharge Instructions     Take antibiotic twice daily as prescribed. Take Mobic once daily as needed: Do not take ibuprofen/Motrin/BC or Goody powder/Aleve when taking this medication. Use ice packs as needed for additional pain life. Important to follow-up with dentist within 1 to 2 weeks. Return for worsening pain, discharge, fever.    ED Prescriptions  Medication Sig Dispense Auth. Provider   amoxicillin (AMOXIL) 500 MG capsule Take 1 capsule (500 mg total) by mouth 2 (two) times daily for 7 days. 14 capsule Hall-Potvin, [place], PA-C   meloxicam (MOBIC) 15 MG tablet Take 1 tablet (15 mg total) by mouth daily. 14 tablet Hall-Potvin, [place], PA-C     PDMP not reviewed this encounter.   Hall-Potvin, [place], [place] 08/19/19 1733

## 2019-08-19 NOTE — ED Triage Notes (Signed)
Pt. States he has had dental pain for 2 weeks there is a hole on the left side, the last tooth in the back. He states he has medicated with ibuprofen to just get to sleep.

## 2019-08-19 NOTE — Discharge Instructions (Addendum)
Take antibiotic twice daily as prescribed. Take Mobic once daily as needed: Do not take ibuprofen/Motrin/BC or Goody powder/Aleve when taking this medication. Use ice packs as needed for additional pain life. Important to follow-up with dentist within 1 to 2 weeks. Return for worsening pain, discharge, fever.

## 2019-11-06 ENCOUNTER — Encounter: Payer: Self-pay | Admitting: Emergency Medicine

## 2019-11-06 ENCOUNTER — Other Ambulatory Visit: Payer: Self-pay

## 2019-11-06 ENCOUNTER — Ambulatory Visit
Admission: EM | Admit: 2019-11-06 | Discharge: 2019-11-06 | Disposition: A | Discharge status: Home / Self Care | Payer: Self-pay | Attending: Emergency Medicine | Admitting: Emergency Medicine

## 2019-11-06 DIAGNOSIS — Z202 Contact with and (suspected) exposure to infections with a predominantly sexual mode of transmission: Secondary | ICD-10-CM | POA: Insufficient documentation

## 2019-11-06 DIAGNOSIS — G8929 Other chronic pain: Secondary | ICD-10-CM | POA: Insufficient documentation

## 2019-11-06 DIAGNOSIS — K089 Disorder of teeth and supporting structures, unspecified: Secondary | ICD-10-CM | POA: Insufficient documentation

## 2019-11-06 MED ORDER — AMOXICILLIN 500 MG PO CAPS: 500.0000 mg | ORAL_CAPSULE | Freq: Two times a day (BID) | ORAL | 0 refills | Status: AC

## 2019-11-06 MED ORDER — DOXYCYCLINE HYCLATE 100 MG PO CAPS: 100.0000 mg | ORAL_CAPSULE | Freq: Two times a day (BID) | ORAL | 0 refills | Status: DC

## 2019-11-06 MED ORDER — MELOXICAM 15 MG PO TABS: 15.0000 mg | ORAL_TABLET | Freq: Every day | ORAL | 0 refills | Status: DC

## 2019-11-06 MED ORDER — AZITHROMYCIN 500 MG PO TABS
1000.0000 mg | ORAL_TABLET | Freq: Once | ORAL | Status: AC
  Administered 2019-11-06: 15:00:00 1000 mg via ORAL

## 2019-11-06 NOTE — ED Notes (Signed)
Patient able to ambulate independently  

## 2019-11-06 NOTE — ED Triage Notes (Signed)
Pt presents to [facility] for assessment after being exposed to Chlamydia.      Pt c/o left dental pain as well.  Pt states tylenol at home, 1500mg  at a time, for relief.

## 2019-11-06 NOTE — ED Provider Notes (Signed)
[name]    CSN: [id number] Arrival date & time: 11/06/19  1421      History   Chief Complaint Chief Complaint  Patient presents with  . Exposure to STD    HPI Kyle Kelly is a 24 y.o. male presenting for multiple concerns. Seeking treatment for chlamydia after being told he was exposed by current sexual partner.  Patient currently sexually active with females only, more than 1 partner, not using condoms.  Patient denying penile or testicular pain, swelling, discharge, anogenital lesions. Patient also noting left lower dental pain as well.  He was previously seen by me 08/21/2019 for same area of concern: Given amoxicillin for suspected dental infection, instructed to follow-up with dentist.  Patient states he finished antibiotics, had complete resolution of pain.  States pain started back up again over the last week.  Has been taking Tylenol at home without significant relief.  Patient states he cannot afford dentist at this time.  Never followed up with low cost community resources.  Denies jaw pain, ear pain, tinnitus, fever.  Past Medical History:  Diagnosis Date  . Asthma    not recent    Patient Active Problem List   Diagnosis Date Noted  . Acute bronchitis 05/28/2017  . Respiratory failure (HCC) 05/27/2017    History reviewed. No pertinent surgical history.     Home Medications    Prior to Admission medications   Medication Sig Start Date End Date Taking? Authorizing Provider  amoxicillin (AMOXIL) 500 MG capsule Take 1 capsule (500 mg total) by mouth 2 (two) times daily for 7 days. 11/06/19 11/13/19  Hall-Potvin, [place], PA-C  beclomethasone (QVAR) 40 MCG/ACT inhaler Inhale 1 puff into the lungs 2 (two) times daily. 05/28/17   Adrian Saran, MD  guaiFENesin-dextromethorphan (ROBITUSSIN DM) 100-10 MG/5ML syrup Take 5 mLs by mouth every 4 (four) hours as needed for cough. 05/28/17   Adrian Saran, MD  meloxicam (MOBIC) 15 MG tablet Take 1 tablet (15 mg  total) by mouth daily. 11/06/19   Hall-Potvin, [place], PA-C  nicotine (NICODERM CQ - DOSED IN MG/24 HOURS) 21 mg/24hr patch Place 1 patch (21 mg total) onto the skin daily. 05/28/17   Adrian Saran, MD    Family History Family History  Problem Relation Age of Onset  . Asthma Mother   . Healthy Father     Social History Social History   Tobacco Use  . Smoking status: Current Every Day Smoker    Packs/day: 0.50    Years: 4.00    Pack years: 2.00    Types: Cigarettes  . Smokeless tobacco: Never Used  Substance Use Topics  . Alcohol use: No  . Drug use: No     Allergies   Sulfonylureas, Sulfa antibiotics, and Sulfur   Review of Systems Review of Systems  Constitutional: Negative for fatigue and fever.  HENT: Positive for dental problem. Negative for congestion, drooling, ear pain, facial swelling, hearing loss, sinus pain, sore throat, trouble swallowing and voice change.   Eyes: Negative for photophobia, pain and visual disturbance.  Respiratory: Negative for cough and shortness of breath.   Cardiovascular: Negative for chest pain and palpitations.  Gastrointestinal: Negative for abdominal pain, diarrhea, nausea and vomiting.  Genitourinary: Negative for discharge, dysuria, frequency, genital sores, penile pain, penile swelling, scrotal swelling, testicular pain and urgency.  Musculoskeletal: Negative for arthralgias and myalgias.  Skin: Negative for color change and rash.  Neurological: Negative for dizziness and headaches.     Physical Exam  Triage Vital Signs ED Triage Vitals [11/06/19 1430]  Enc Vitals Group     BP 136/82     Pulse Rate 68     Resp 16     Temp 98 F (36.7 C)     Temp Source Oral     SpO2 97 %     Weight      Height      Head Circumference      Peak Flow      Pain Score      Pain Loc      Pain Edu?      Excl. in [place]?    No data found.  Updated Vital Signs BP 136/82 (BP Location: Left Arm)   Pulse 68   Temp 98 F (36.7 C) (Oral)    Resp 16   SpO2 97%   Visual Acuity Right Eye Distance:   Left Eye Distance:   Bilateral Distance:    Right Eye Near:   Left Eye Near:    Bilateral Near:     Physical Exam Constitutional:      General: He is not in acute distress. HENT:     Head: Normocephalic and atraumatic.     Mouth/Throat:     Mouth: Mucous membranes are moist.     Pharynx: Oropharynx is clear.     Comments: Fair dentition.  Left lower rear molar with decay, whole.  Slight surrounding erythema.  No fluctuance appreciated.  Tooth is TTP, without discharge. Eyes:     General: No scleral icterus.    Pupils: Pupils are equal, round, and reactive to light.  Cardiovascular:     Rate and Rhythm: Normal rate.  Pulmonary:     Effort: Pulmonary effort is normal. No respiratory distress.     Breath sounds: No wheezing.  Genitourinary:    Comments: Patient declined, self swab performed Skin:    Coloration: Skin is not jaundiced or pale.  Neurological:     Mental Status: He is alert and oriented to person, place, and time.      UC Treatments / Results  Labs (all labs ordered are listed, but only abnormal results are displayed) Labs Reviewed  CYTOLOGY, (ORAL, ANAL, URETHRAL) ANCILLARY ONLY    EKG   Radiology No results found.  Procedures Procedures (including critical care time)  Medications Ordered in UC Medications  azithromycin (ZITHROMAX) tablet 1,000 mg (1,000 mg Oral Given 11/06/19 1525)    Initial Impression / Assessment and Plan / UC Course  I have reviewed the triage vital signs and the nursing notes.  Pertinent labs & imaging results that were available during my care of the patient were reviewed by me and considered in my medical decision making (see chart for details).     Patient afebrile, nontoxic.  Given 1 g azithromycin office which he tolerated well for treatment of known chlamydia exposure.  Cytology pending: We will treat concurrent infection if indicated. Regarding now  chronic dental pain: We will cover with amoxicillin as outlined below.  Provided low cost community dental resources and stress importance of follow-up for further evaluation/management as patient risks recurrent/worsening infections.  Return precautions discussed, patient verbalized understanding and is agreeable to plan. Final Clinical Impressions(s) / UC Diagnoses   Final diagnoses:  Chronic dental pain  Exposure to chlamydia     Discharge Instructions     Low-Cost Community Dental Resources:  [facility] - [facility] Department Phone number: [phone]  - [facility] Address: [address]  [address], [place], [place], [address] Phone:[phone] Ext.[phone] *Schedule: 2nd and 4thThursday of the month at 6:30a.m.(simple extraction only - no wisdom teeth or surgery) *First come/First serve bases - First 10 clients served  - [facility] [place], [place], and [employer] residents only) Address: [address], [place], [place], [address] Phone:[phone]  - [facility] Address: [address], [place], [place], [address] Phone: [phone]  [facility] Address: [address], [place], [place], [address] Phone: [phone]  - Dr. Donn Pierini Address: [address] [place], [place], [address] Phone: [phone]    ED Prescriptions    Medication Sig Dispense Auth. Provider   meloxicam (MOBIC) 15 MG tablet Take 1 tablet (15 mg total) by mouth daily. 14 tablet Hall-Potvin, [place], PA-C   doxycycline (VIBRAMYCIN) 100 MG capsule  (Status: Discontinued) Take 1 capsule (100 mg total) by mouth 2 (two) times daily for 7 days. 14 capsule Hall-Potvin, [place], PA-C   amoxicillin (AMOXIL) 500 MG capsule Take 1 capsule (500 mg total) by mouth 2 (two) times daily for 7 days. 14 capsule Hall-Potvin, [place], PA-C     PDMP not reviewed this encounter.   Hall-Potvin, [place], [place] 11/07/19 1357

## 2019-11-06 NOTE — Discharge Instructions (Addendum)
Proofreader Resources:  [facility] Department Phone number: [phone]  - [facility] Address: [address] [place], [place], [address] [phone] Ext.[phone] *Schedule: 2nd and 4thThursday of the month at 6:30a.m.(simple extraction only - no wisdom teeth or surgery) *First come/First serve bases - First 10 clients served  - [facility] [place], [place], and [employer] residents only) Address: [address] [place], [place], [place], [address] Phone:[phone]  - [facility] Address: [address] [place], [place], [place], [address] Phone: [phone]  [facility] Address: [address], [place], [place], [address] Phone: [phone]  - Dr. Lawrence Marseilles Address: [address] [place], [place], [address] Phone: [phone]

## 2019-11-08 LAB — CYTOLOGY, (ORAL, ANAL, URETHRAL) ANCILLARY ONLY
Chlamydia: POSITIVE — AB
Neisseria Gonorrhea: NEGATIVE
Trichomonas: NEGATIVE

## 2019-11-09 ENCOUNTER — Telehealth (HOSPITAL_COMMUNITY): Payer: Self-pay | Admitting: Emergency Medicine

## 2019-11-09 NOTE — Telephone Encounter (Signed)
Chlamydia is positive.  This was treated at the urgent care visit with po zithromax 1g.  Please refrain from sexual intercourse for 7 days to give the medicine time to work.  Sexual partners need to be notified and tested/treated.  Condoms may reduce risk of reinfection.  Recheck or followup with PCP for further evaluation if symptoms are not improving.  [facility] notified.  Attempted to reach patient. No answer at this time. Voicemail left.     

## 2019-11-11 ENCOUNTER — Telehealth (HOSPITAL_COMMUNITY): Payer: Self-pay | Admitting: Emergency Medicine

## 2019-11-11 NOTE — Telephone Encounter (Signed)
Attempted to reach patient x2. No answer at this time. Voicemail left.    

## 2019-11-12 ENCOUNTER — Telehealth (HOSPITAL_COMMUNITY): Payer: Self-pay | Admitting: Emergency Medicine

## 2019-11-12 NOTE — Telephone Encounter (Signed)
Attempted to reach patient x3. No answer at this time. Voicemail left. Letter sent    

## 2020-04-01 ENCOUNTER — Ambulatory Visit
Admission: EM | Admit: 2020-04-01 | Discharge: 2020-04-01 | Disposition: A | Discharge status: Home / Self Care | Payer: Self-pay | Attending: Physician Assistant | Admitting: Physician Assistant

## 2020-04-01 ENCOUNTER — Other Ambulatory Visit: Payer: Self-pay

## 2020-04-01 DIAGNOSIS — K0889 Other specified disorders of teeth and supporting structures: Secondary | ICD-10-CM

## 2020-04-01 MED ORDER — DICLOFENAC SODIUM 75 MG PO TBEC: 75.0000 mg | DELAYED_RELEASE_TABLET | Freq: Two times a day (BID) | ORAL | 0 refills | Status: DC

## 2020-04-01 MED ORDER — AMOXICILLIN 500 MG PO CAPS: 500.0000 mg | ORAL_CAPSULE | Freq: Three times a day (TID) | ORAL | 0 refills | Status: DC

## 2020-04-01 NOTE — ED Triage Notes (Signed)
Pt c/o lt lower tooth ache for over a year. C/o swelling to lt cheek for 3wks

## 2020-04-01 NOTE — ED Provider Notes (Signed)
[name]    CSN: [id number] Arrival date & time: 04/01/20  1722      History   Chief Complaint Chief Complaint  Patient presents with  . Dental Pain    HPI Kyle Kelly is a 24 y.o. male.   The history is provided by the patient. No language interpreter was used.  Dental Pain Location:  Lower Lower teeth location:  18/LL 2nd molar Quality:  Aching Severity:  Moderate Timing:  Constant Progression:  Worsening Chronicity:  New Context: dental caries   Relieved by:  Nothing Worsened by:  Nothing Associated symptoms: facial pain   Risk factors: no periodontal disease   Pt complains of a toothache  Past Medical History:  Diagnosis Date  . Asthma    not recent    Patient Active Problem List   Diagnosis Date Noted  . Acute bronchitis 05/28/2017  . Respiratory failure ([facility]) 05/27/2017    History reviewed. No pertinent surgical history.     Home Medications    Prior to Admission medications   Medication Sig Start Date End Date Taking? Authorizing Provider  amoxicillin (AMOXIL) 500 MG capsule Take 1 capsule (500 mg total) by mouth 3 (three) times daily. 04/01/20   Fransico Meadow, PA-C  diclofenac (VOLTAREN) 75 MG EC tablet Take 1 tablet (75 mg total) by mouth 2 (two) times daily. 04/01/20   Fransico Meadow, PA-C    Family History Family History  Problem Relation Age of Onset  . Asthma Mother   . Healthy Father     Social History Social History   Tobacco Use  . Smoking status: Current Every Day Smoker    Packs/day: 0.50    Years: 4.00    Pack years: 2.00    Types: Cigarettes  . Smokeless tobacco: Never Used  Substance Use Topics  . Alcohol use: No  . Drug use: No     Allergies   Sulfonylureas, Sulfa antibiotics, and Sulfur   Review of Systems Review of Systems  All other systems reviewed and are negative.    Physical Exam Triage Vital Signs ED Triage Vitals [04/01/20 1745]  Enc Vitals Group     BP 136/79     Pulse  Rate 68     Resp 16     Temp 98.1 F (36.7 C)     Temp Source Oral     SpO2 98 %     Weight      Height      Head Circumference      Peak Flow      Pain Score 10     Pain Loc      Pain Edu?      Excl. in [place]?    No data found.  Updated Vital Signs BP 136/79 (BP Location: Left Arm)   Pulse 68   Temp 98.1 F (36.7 C) (Oral)   Resp 16   SpO2 98%   Visual Acuity Right Eye Distance:   Left Eye Distance:   Bilateral Distance:    Right Eye Near:   Left Eye Near:    Bilateral Near:     Physical Exam Vitals and nursing note reviewed.  Constitutional:      Appearance: He is well-developed.  HENT:     Head: Normocephalic and atraumatic.     Mouth/Throat:     Comments: Broken left lower 2nd molar  Eyes:     Conjunctiva/sclera: Conjunctivae normal.  Cardiovascular:     Rate and Rhythm:  Normal rate and regular rhythm.     Heart sounds: No murmur.  Pulmonary:     Effort: Pulmonary effort is normal. No respiratory distress.     Breath sounds: Normal breath sounds.  Abdominal:     Tenderness: There is no abdominal tenderness.  Musculoskeletal:     Cervical back: Neck supple.  Skin:    General: Skin is warm and dry.  Neurological:     Mental Status: He is alert.  Psychiatric:        Mood and Affect: Mood normal.      UC Treatments / Results  Labs (all labs ordered are listed, but only abnormal results are displayed) Labs Reviewed - No data to display  EKG   Radiology No results found.  Procedures Procedures (including critical care time)  Medications Ordered in UC Medications - No data to display  Initial Impression / Assessment and Plan / UC Course  I have reviewed the triage vital signs and the nursing notes.  Pertinent labs & imaging results that were available during my care of the patient were reviewed by me and considered in my medical decision making (see chart for details).     MDM: Pt advised to follow up with dentist for evaluation    Final Clinical Impressions(s) / UC Diagnoses   Final diagnoses:  Toothache     Discharge Instructions     Return if any problems.   ED Prescriptions    Medication Sig Dispense Auth. Provider   amoxicillin (AMOXIL) 500 MG capsule Take 1 capsule (500 mg total) by mouth 3 (three) times daily. 30 capsule Kassandra Meriweather K, [place]   diclofenac (VOLTAREN) 75 MG EC tablet Take 1 tablet (75 mg total) by mouth 2 (two) times daily. 30 tablet Elson Areas, [place]     PDMP not reviewed this encounter.  An After Visit Summary was printed and given to the patient.    Elson Areas, [place] 04/01/20 1814

## 2020-04-01 NOTE — Discharge Instructions (Signed)
Return if any problems.

## 2020-05-10 ENCOUNTER — Other Ambulatory Visit: Payer: Self-pay

## 2020-05-10 ENCOUNTER — Emergency Department (HOSPITAL_COMMUNITY)
Admission: EM | Admit: 2020-05-10 | Discharge: 2020-05-10 | Disposition: A | Discharge status: Home / Self Care | Payer: Medicaid Other | Attending: Emergency Medicine | Admitting: Emergency Medicine

## 2020-05-10 DIAGNOSIS — R030 Elevated blood-pressure reading, without diagnosis of hypertension: Secondary | ICD-10-CM | POA: Insufficient documentation

## 2020-05-10 DIAGNOSIS — F17210 Nicotine dependence, cigarettes, uncomplicated: Secondary | ICD-10-CM | POA: Insufficient documentation

## 2020-05-10 DIAGNOSIS — R21 Rash and other nonspecific skin eruption: Secondary | ICD-10-CM

## 2020-05-10 DIAGNOSIS — K029 Dental caries, unspecified: Secondary | ICD-10-CM | POA: Insufficient documentation

## 2020-05-10 DIAGNOSIS — J45909 Unspecified asthma, uncomplicated: Secondary | ICD-10-CM | POA: Insufficient documentation

## 2020-05-10 MED ORDER — PENICILLIN V POTASSIUM 500 MG PO TABS
500.0000 mg | ORAL_TABLET | Freq: Four times a day (QID) | ORAL | 0 refills | Status: AC
Start: 2020-05-10 — End: 2020-05-17

## 2020-05-10 MED ORDER — OXYCODONE HCL 5 MG PO TABS
5.0000 mg | ORAL_TABLET | Freq: Once | ORAL | Status: AC
  Administered 2020-05-10: 5 mg via ORAL
  Filled 2020-05-10: qty 1

## 2020-05-10 MED ORDER — PENICILLIN V POTASSIUM 500 MG PO TABS
500.0000 mg | ORAL_TABLET | Freq: Once | ORAL | Status: AC
  Administered 2020-05-10: 500 mg via ORAL
  Filled 2020-05-10: qty 1

## 2020-05-10 MED ORDER — HYDROCORTISONE 1 % EX CREA: TOPICAL_CREAM | CUTANEOUS | 0 refills | Status: DC

## 2020-05-10 MED ORDER — CHLORHEXIDINE GLUCONATE 0.12 % MT SOLN: 15.0000 mL | Freq: Two times a day (BID) | OROMUCOSAL | 0 refills | Status: DC

## 2020-05-10 NOTE — ED Triage Notes (Signed)
Patient reports dental pain ,tootache left bottom side, left side of face is swelling. Patient reports pain 10/10, hasn't eaten for two days.  Patient requesting nerve block until he can make it to dentist. Patient actively seeking dentist but has no insurance. [facility]ated on [facility].

## 2020-05-10 NOTE — Discharge Instructions (Signed)
At this time there does not appear to be the presence of an emergent medical condition, however there is always the potential for conditions to change. Please read and follow the below instructions.  Please return to the Emergency Department immediately for any new or worsening symptoms. Please be sure to follow up with your Primary Care Provider within one week regarding your visit today; please call their office to schedule an appointment even if you are feeling better for a follow-up visit. Call the dentist Dr. Rocky Morel on your discharge paperwork to schedule a follow-up appointment for definitive dental care. Please take your antibiotic Penicillin as prescribed until complete to help with your symptoms.  Please drink enough water to avoid dehydration and get plenty of rest. You may use the chlorhexidine mouth rinse as prescribed to help with your dental infections. You were given a pain pill in the emergency department today called oxycodone.  Oxycodone will make you drowsy so do not drive, drink alcohol or perform any dangerous activities for the rest of the day. You may use the hydrocortisone cream prescribed today to help with your rash.  Do not use this cream for more than 5 days. Your blood pressure was elevated in the emergency department today, please have your blood pressure rechecked by your primary care provider at your follow-up visit this week.  Get help right away if: You cannot open your mouth. You are having trouble breathing or swallowing. You have a fever. Your face, neck, or jaw is swollen. Get a very bad headache. Start to feel mixed up (confused). Feel weak or numb. Feel faint. Have very bad pain in your: Chest. Belly (abdomen). Throw up more than once. Have trouble breathing. You have any new/concerning or worsening of symptoms  Please read the additional information packets attached to your discharge summary.  Do not take your medicine if  develop an itchy rash,  swelling in your mouth or lips, or difficulty breathing; call 911 and seek immediate emergency medical attention if this occurs.  You may review your lab tests and imaging results in their entirety on your MyChart account.  Please discuss all results of fully with your primary care provider and other specialist at your follow-up visit.  Note: Portions of this text may have been transcribed using voice recognition software. Every effort was made to ensure accuracy; however, inadvertent computerized transcription errors may still be present.

## 2020-05-10 NOTE — ED Provider Notes (Signed)
[facility]-EMERGENCY DEPT Provider Note   CSN: [id number] Arrival date & time: 05/10/20  1026     History Chief Complaint  Patient presents with  . dental abscess    Kyle Kelly is a 24 y.o. male history of asthma otherwise healthy.  Patient presents today for left lower dental pain onset 8 years ago worsening over the last 7 days, no clear aggravating event.  He does not have a dentist.  He describes pain as severe constant nonradiating worsened with chewing on the left side minimally improved with Tylenol which she took earlier this morning, pain is an aching sensation.  Associated symptom, patient feels that his left face is swollen.  Patient second concern today is a small rash to his right [facility] that is been present for 2 days, he recently started a lawn care business.  He reports that his pruritic and nonpainful.  No known exposures but does work with plants.  Denies fevers/chills, headache, vision changes, trismus, difficulty swallowing, voice change, throat swelling, nausea/vomiting, abdominal pain, diarrhea or any additional concerns. HPI     Past Medical History:  Diagnosis Date  . Asthma    not recent    Patient Active Problem List   Diagnosis Date Noted  . Acute bronchitis 05/28/2017  . Respiratory failure (HCC) 05/27/2017    No past surgical history on file.     Family History  Problem Relation Age of Onset  . Asthma Mother   . Healthy Father     Social History   Tobacco Use  . Smoking status: Current Every Day Smoker    Packs/day: 0.50    Years: 4.00    Pack years: 2.00    Types: Cigarettes  . Smokeless tobacco: Never Used  Substance Use Topics  . Alcohol use: No  . Drug use: No    Home Medications Prior to Admission medications   Medication Sig Start Date End Date Taking? Authorizing Provider  amoxicillin (AMOXIL) 500 MG capsule Take 1 capsule (500 mg total) by mouth 3 (three) times daily. 04/01/20   Elson Areas,  PA-C  chlorhexidine (PERIDEX) 0.12 % solution Use as directed 15 mLs in the mouth or throat 2 (two) times daily. 05/10/20   Bill Salinas, PA-C  diclofenac (VOLTAREN) 75 MG EC tablet Take 1 tablet (75 mg total) by mouth 2 (two) times daily. 04/01/20   Elson Areas, PA-C  hydrocortisone cream 1 % Apply to affected area 2 times daily 05/10/20   Harlene Salts A, PA-C  penicillin v potassium (VEETID) 500 MG tablet Take 1 tablet (500 mg total) by mouth 4 (four) times daily for 7 days. 05/10/20 05/17/20  Harlene Salts A, PA-C    Allergies    Sulfonylureas, Sulfa antibiotics, and Sulfur  Review of Systems   Review of Systems  Constitutional: Negative.  Negative for chills and fever.  HENT: Positive for dental problem and facial swelling. Negative for sore throat, trouble swallowing and voice change.   Eyes: Negative.  Negative for visual disturbance.  Gastrointestinal: Negative.  Negative for abdominal pain, diarrhea, nausea and vomiting.  Musculoskeletal: Negative.  Negative for neck pain.  Skin: Positive for rash.  Neurological: Negative.  Negative for headaches.    Physical Exam Updated Vital Signs BP (!) 135/119 (BP Location: Right Arm)   Pulse 60   Temp 98.2 F (36.8 C) (Oral)   Resp 16   Ht 6\' 2"  (1.88 m)   Wt 101.2 kg   SpO2 100%  BMI 28.63 kg/m   Physical Exam Constitutional:      General: He is not in acute distress.    Appearance: Normal appearance. He is well-developed. He is not ill-appearing or diaphoretic.  HENT:     Head: Normocephalic and atraumatic.     Jaw: There is normal jaw occlusion. No trismus.     Right Ear: Tympanic membrane and external ear normal.     Left Ear: Tympanic membrane and external ear normal.     Nose: Nose normal.     Mouth/Throat:      Comments: Multiple dental caries, poor dentition overall.  Large dental carry and pain to percussion tooth #17.  The patient has normal phonation and is in control of secretions. No stridor.   Midline uvula without edema. Soft palate rises symmetrically. No tonsillar erythema, swelling or exudates. Tongue protrusion is normal, floor of mouth is soft. No trismus. No creptius on neck palpation. No gingival erythema or fluctuance noted. Mucus membranes moist. No pallor noted.  Describe rash and hives visit note dragon Yetta Flock is a right poison ivy hip abducts psychotic-like lines across the lower mouth spell described Eyes:     General: Vision grossly intact. Gaze aligned appropriately.     Extraocular Movements: Extraocular movements intact.     Pupils: Pupils are equal, round, and reactive to light.  Neck:     Trachea: Trachea and phonation normal. No tracheal tenderness or tracheal deviation.  Pulmonary:     Effort: Pulmonary effort is normal. No respiratory distress.  Abdominal:     General: There is no distension.     Palpations: Abdomen is soft.     Tenderness: There is no abdominal tenderness. There is no guarding or rebound.  Musculoskeletal:        General: Normal range of motion.     Cervical back: Normal range of motion and neck supple. No rigidity or crepitus.  Skin:    General: Skin is warm and dry.     Findings: Rash present. Rash is papular.          Comments: Few erythematous papules, appear somewhat linear in the right AC.  Neurological:     Mental Status: He is alert.     GCS: GCS eye subscore is 4. GCS verbal subscore is 5. GCS motor subscore is 6.     Comments: Speech is clear and goal oriented, follows commands Major Cranial nerves without deficit, no facial droop Moves extremities without ataxia, coordination intact  Psychiatric:        Behavior: Behavior normal.     ED Results / Procedures / Treatments   Labs (all labs ordered are listed, but only abnormal results are displayed) Labs Reviewed - No data to display  EKG None  Radiology No results found.  Procedures Procedures (including critical care time)  Medications Ordered in  ED Medications  oxyCODONE (Oxy IR/ROXICODONE) immediate release tablet 5 mg (5 mg Oral Given 05/10/20 1319)  penicillin v potassium (VEETID) tablet 500 mg (500 mg Oral Given 05/10/20 1319)    ED Course  I have reviewed the triage vital signs and the nursing notes.  Pertinent labs & imaging results that were available during my care of the patient were reviewed by me and considered in my medical decision making (see chart for details).    MDM Rules/Calculators/A&P                          Additional History  Obtained: 1. Nursing notes from this visit. 2. EMR reviewed, patient was seen in urgent care on April 01, 2020. Appears the same tooth was causing him pain at that time, he was prescribed Voltaren and Amoxil. He was given dental referral. ----- 24 year old male presents with acute on chronic left lower dental pain. Infected dental surface cavity noted, without signs or symptoms of dental abscess, no swelling/erythema/tenderness of the gums.  Patient is well-appearing, afebrile, nontoxic, speaking well.  Patient able to swallow without pain.  No signs of swelling or concern for Ludwig's angina/Peritonsilar abscess/Retropharyngeal abscess or other deep tissue infections.  No sign of swelling of the neck, patient has good range of motion of the neck, no trismus. We will treat with penicillin VK, chlorhexidine. 1 pill oxycodone given for acute dental pain here, he reports he has a ride home today. We discussed narcotic precautions and patient stated understanding. He was given referral to dentist for definitive dental care.  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 Patient denies any difficulty breathing or swallowing.  Pt has a patent airway without stridor and is handling secretions without difficulty; no angioedema. No blisters, no pustules, no warmth, no draining sinus tracts, no  superficial abscesses, no bullous impetigo, no vesicles, no desquamation, no target lesions with dusky purpura or a central bulla. Not tender to touch. No concern for superimposed infection. No concern for SJS, TEN, TSS, tick borne illness, syphilis or other life-threatening condition. We will treat with hydrocortisone 1% and have him follow-up with his primary care provider. Referral given for local primary care provider.  Incidentally patient had elevated blood pressure reading, he was informed of this and to follow-up with PCP for blood pressure recheck, suspect elevated blood pressure secondary to dental pain today, should improve with pain control. Patient informed of signs/symptoms of hypertensive urgency/emergency and to return to the ER if they occur.  At this time there does not appear to be any evidence of an acute emergency medical condition and the patient appears stable for discharge with appropriate outpatient follow up. Diagnosis was discussed with patient who verbalizes understanding of care plan and is agreeable to discharge. I have discussed return precautions with patient who verbalizes understanding. Patient encouraged to follow-up with their PCP and dentist. All questions answered.  Note: Portions of this report may have been transcribed using voice recognition software. Every effort was made to ensure accuracy; however, inadvertent computerized transcription errors may still be present. Final Clinical Impression(s) / ED Diagnoses Final diagnoses:  Pain due to dental caries  Rash  Elevated blood pressure reading    Rx / DC Orders ED Discharge Orders         Ordered    hydrocortisone cream 1 %     Discontinue  Reprint     05/10/20 1352    penicillin v potassium (VEETID) 500 MG tablet  4 times daily     Discontinue  Reprint     05/10/20 1352    chlorhexidine (PERIDEX) 0.12 % solution  2 times daily     Discontinue  Reprint     05/10/20 1352           Elizabeth Palau 05/10/20 1410    Raeford Razor, MD 05/17/20 1128

## 2020-06-13 ENCOUNTER — Emergency Department (HOSPITAL_COMMUNITY): Payer: Medicaid Other

## 2020-06-13 ENCOUNTER — Emergency Department (HOSPITAL_COMMUNITY)
Admission: EM | Admit: 2020-06-13 | Discharge: 2020-06-13 | Disposition: A | Discharge status: Home / Self Care | Payer: Medicaid Other | Attending: Emergency Medicine | Admitting: Emergency Medicine

## 2020-06-13 ENCOUNTER — Encounter (HOSPITAL_COMMUNITY): Payer: Self-pay

## 2020-06-13 DIAGNOSIS — W228XXA Striking against or struck by other objects, initial encounter: Secondary | ICD-10-CM | POA: Insufficient documentation

## 2020-06-13 DIAGNOSIS — Y939 Activity, unspecified: Secondary | ICD-10-CM | POA: Insufficient documentation

## 2020-06-13 DIAGNOSIS — Y999 Unspecified external cause status: Secondary | ICD-10-CM | POA: Insufficient documentation

## 2020-06-13 DIAGNOSIS — Y929 Unspecified place or not applicable: Secondary | ICD-10-CM | POA: Insufficient documentation

## 2020-06-13 DIAGNOSIS — F17210 Nicotine dependence, cigarettes, uncomplicated: Secondary | ICD-10-CM | POA: Insufficient documentation

## 2020-06-13 DIAGNOSIS — S80211A Abrasion, right knee, initial encounter: Secondary | ICD-10-CM | POA: Insufficient documentation

## 2020-06-13 MED ORDER — TETANUS-DIPHTH-ACELL PERTUSSIS 5-2.5-18.5 LF-MCG/0.5 IM SUSP
0.5000 mL | Freq: Once | INTRAMUSCULAR | Status: DC
  Filled 2020-06-13: qty 0.5

## 2020-06-13 NOTE — Discharge Instructions (Addendum)
Get help right away if: °You have a red streak spreading away from your wound. °You have a fever. °You have fluid, blood, or pus coming from your wound. °You notice a bad smell coming from your wound or your dressing. °

## 2020-06-13 NOTE — ED Provider Notes (Signed)
[facility]-EMERGENCY DEPT Provider Note   CSN: [id number] Arrival date & time: 06/13/20  1303     History Chief Complaint  Patient presents with  . Leg Pain    Kyle Kelly is a 24 y.o. male with a past medical history patient was at [facility] when his leg got stuck in a metal gate that swung closed.  He complains of pain at the top of his knee.  He is able to ambulate with pain.  He has an abrasion and states that he is not up-to-date on his tetanus.  He denies any weakness numbness.  HPI     Past Medical History:  Diagnosis Date  . Asthma    not recent    Patient Active Problem List   Diagnosis Date Noted  . Acute bronchitis 05/28/2017  . Respiratory failure (HCC) 05/27/2017    History reviewed. No pertinent surgical history.     Family History  Problem Relation Age of Onset  . Asthma Mother   . Healthy Father     Social History   Tobacco Use  . Smoking status: Current Every Day Smoker    Packs/day: 0.50    Years: 4.00    Pack years: 2.00    Types: Cigarettes  . Smokeless tobacco: Never Used  Substance Use Topics  . Alcohol use: No  . Drug use: No    Home Medications Prior to Admission medications   Medication Sig Start Date End Date Taking? Authorizing Provider  amoxicillin (AMOXIL) 500 MG capsule Take 1 capsule (500 mg total) by mouth 3 (three) times daily. 04/01/20   Elson Areas, PA-C  chlorhexidine (PERIDEX) 0.12 % solution Use as directed 15 mLs in the mouth or throat 2 (two) times daily. 05/10/20   Bill Salinas, PA-C  diclofenac (VOLTAREN) 75 MG EC tablet Take 1 tablet (75 mg total) by mouth 2 (two) times daily. 04/01/20   Elson Areas, PA-C  hydrocortisone cream 1 % Apply to affected area 2 times daily 05/10/20   Harlene Salts A, PA-C    Allergies    Sulfonylureas, Sulfa antibiotics, and Sulfur  Review of Systems   Review of Systems Ten systems reviewed and are negative for acute change, except as noted in  the HPI.   Physical Exam Updated Vital Signs BP 128/75 (BP Location: Right Arm)   Pulse 70   Temp 98.7 F (37.1 C) (Oral)   Resp 18   SpO2 96%   Physical Exam Vitals and nursing note reviewed.  Constitutional:      General: He is not in acute distress.    Appearance: He is well-developed. He is not diaphoretic.  HENT:     Head: Normocephalic and atraumatic.  Eyes:     General: No scleral icterus.    Conjunctiva/sclera: Conjunctivae normal.  Cardiovascular:     Rate and Rhythm: Normal rate and regular rhythm.     Heart sounds: Normal heart sounds.  Pulmonary:     Effort: Pulmonary effort is normal. No respiratory distress.     Breath sounds: Normal breath sounds.  Abdominal:     Palpations: Abdomen is soft.     Tenderness: There is no abdominal tenderness.  Musculoskeletal:     Cervical back: Normal range of motion and neck supple.     Comments: Abrasion to the superior patellar region on the lateral side.  Mild tenderness over the abrasion.  Ligaments are stable including anterior posterior drawer.  Full passive and active range  of motion.  Pain worse with deep flexion.  Skin:    General: Skin is warm and dry.  Neurological:     Mental Status: He is alert.  Psychiatric:        Behavior: Behavior normal.     ED Results / Procedures / Treatments   Labs (all labs ordered are listed, but only abnormal results are displayed) Labs Reviewed - No data to display  EKG None  Radiology DG Knee Complete 4 Views Right  Result Date: 06/13/2020 CLINICAL DATA:  Right knee pain after getting hit with a door. EXAM: RIGHT KNEE - COMPLETE 4+ VIEW COMPARISON:  Knee radiograph dated 12/23/2012. FINDINGS: No evidence of fracture, dislocation, or joint effusion. No evidence of arthropathy or other focal bone abnormality. Soft tissues are unremarkable. IMPRESSION: Negative. Electronically Signed   By: Romona Curls M.D.   On: 06/13/2020 14:06    Procedures Procedures (including  critical care time)  Medications Ordered in ED Medications  Tdap (BOOSTRIX) injection 0.5 mL (0.5 mLs Intramuscular Refused 06/13/20 1556)    ED Course  I have reviewed the triage vital signs and the nursing notes.  Pertinent labs & imaging results that were available during my care of the patient were reviewed by me and considered in my medical decision making (see chart for details).    MDM Rules/Calculators/A&P                          24 year old male with knee injury. He has refused a tetanus shot. He understands the risks. Patient X-Ray negative for obvious fracture or dislocation. Pain managed in ED. Pt advised to follow up with orthopedics if symptoms persist for possibility of missed fracture diagnosis. Patient given brace while in ED, conservative therapy recommended and discussed. Patient will be dc home & is agreeable with above plan.  Final Clinical Impression(s) / ED Diagnoses Final diagnoses:  Abrasion of right knee, initial encounter    Rx / DC Orders ED Discharge Orders    None       Arthor Captain, PA-C 06/14/20 0002    Mancel Bale, MD 06/14/20 2344

## 2020-06-13 NOTE — ED Triage Notes (Signed)
Patient here from [facility] reporting R leg injury stating that door at the [employer] hit him in the knee.

## 2020-06-13 NOTE — ED Notes (Signed)
Ortho tech called 

## 2021-01-31 ENCOUNTER — Other Ambulatory Visit: Payer: Self-pay

## 2021-01-31 ENCOUNTER — Encounter: Payer: Self-pay | Admitting: Emergency Medicine

## 2021-01-31 ENCOUNTER — Ambulatory Visit
Admission: EM | Admit: 2021-01-31 | Discharge: 2021-01-31 | Disposition: A | Discharge status: Home / Self Care | Payer: Medicaid Other | Attending: Urgent Care | Admitting: Urgent Care

## 2021-01-31 DIAGNOSIS — Z202 Contact with and (suspected) exposure to infections with a predominantly sexual mode of transmission: Secondary | ICD-10-CM | POA: Insufficient documentation

## 2021-01-31 DIAGNOSIS — Z113 Encounter for screening for infections with a predominantly sexual mode of transmission: Secondary | ICD-10-CM | POA: Insufficient documentation

## 2021-01-31 MED ORDER — METRONIDAZOLE 500 MG PO TABS
500.0000 mg | ORAL_TABLET | Freq: Two times a day (BID) | ORAL | 0 refills | Status: DC
Start: 2021-01-31 — End: 2022-09-26

## 2021-01-31 NOTE — Discharge Instructions (Signed)
Avoid all forms of sexual intercourse (oral, vaginal, anal) for the next 7 days to avoid spreading/reinfecting. Return if symptoms worsen/do not resolve, you develop fever, abdominal pain, blood in your urine, or are re-exposed to an STI.  

## 2021-01-31 NOTE — ED Triage Notes (Signed)
Pt would like to be tested for STD. Pt denies any sx

## 2021-01-31 NOTE — ED Provider Notes (Signed)
  [facility]   MRN: [id number] DOB: 09/27/1996  Subjective:   Kyle Kelly is a 25 y.o. male presenting for STI check.  Patient states that he had unprotected sex 2 weeks ago with a male partner that ended up testing positive for trichomoniasis. Denies dysuria, hematuria, urinary frequency, penile discharge, penile swelling, testicular pain, testicular swelling, anal pain, groin pain.    Denies taking chronic medications.    Allergies  Allergen Reactions  . Sulfonylureas Anaphylaxis  . Elemental Sulfur   . Sulfa Antibiotics     Past Medical History:  Diagnosis Date  . Asthma    not recent     History reviewed. No pertinent surgical history.  Family History  Problem Relation Age of Onset  . Asthma Mother   . Healthy Father     Social History   Tobacco Use  . Smoking status: Current Every Day Smoker    Packs/day: 0.50    Years: 4.00    Pack years: 2.00    Types: Cigarettes  . Smokeless tobacco: Never Used  Substance Use Topics  . Alcohol use: No  . Drug use: No    ROS   Objective:   Vitals: BP 125/74 (BP Location: Left Arm)   Pulse 63   Temp (!) 97.4 F (36.3 C) (Oral)   Resp 18   SpO2 98%   Physical Exam Constitutional:      General: He is not in acute distress.    Appearance: Normal appearance. He is well-developed and normal weight. He is not ill-appearing, toxic-appearing or diaphoretic.  HENT:     Head: Normocephalic and atraumatic.     Right Ear: External ear normal.     Left Ear: External ear normal.     Nose: Nose normal.     Mouth/Throat:     Pharynx: Oropharynx is clear.  Eyes:     General: No scleral icterus.       Right eye: No discharge.        Left eye: No discharge.     Extraocular Movements: Extraocular movements intact.     Pupils: Pupils are equal, round, and reactive to light.  Cardiovascular:     Rate and Rhythm: Normal rate.  Pulmonary:     Effort: Pulmonary effort is normal.  Musculoskeletal:      Cervical back: Normal range of motion.  Neurological:     Mental Status: He is alert and oriented to person, place, and time.  Psychiatric:        Mood and Affect: Mood normal.        Behavior: Behavior normal.        Thought Content: Thought content normal.        Judgment: Judgment normal.     Assessment and Plan :   PDMP not reviewed this encounter.  1. Exposure to trichomonas   2. Screening examination for STD (sexually transmitted disease)     Given his exposure we will treat empirically with Flagyl.  Labs pending.  Counseled on abstaining to assure that he clears the infection. Counseled patient on potential for adverse effects with medications prescribed/recommended today, ER and return-to-clinic precautions discussed, patient verbalized understanding.    Wallis Bamberg, [place] 01/31/21 [phone]

## 2021-02-01 LAB — CYTOLOGY, (ORAL, ANAL, URETHRAL) ANCILLARY ONLY
Chlamydia: NEGATIVE
Comment: NEGATIVE
Comment: NEGATIVE
Comment: NORMAL
Neisseria Gonorrhea: NEGATIVE
Trichomonas: POSITIVE — AB

## 2021-02-01 LAB — HIV ANTIBODY (ROUTINE TESTING W REFLEX): HIV Screen 4th Generation wRfx: NONREACTIVE

## 2021-02-01 LAB — RPR: RPR Ser Ql: NONREACTIVE

## 2021-04-08 ENCOUNTER — Ambulatory Visit
Admission: EM | Admit: 2021-04-08 | Discharge: 2021-04-08 | Disposition: A | Discharge status: Home / Self Care | Payer: Self-pay | Attending: Family Medicine | Admitting: Family Medicine

## 2021-04-08 ENCOUNTER — Other Ambulatory Visit: Payer: Self-pay

## 2021-04-08 ENCOUNTER — Ambulatory Visit (INDEPENDENT_AMBULATORY_CARE_PROVIDER_SITE_OTHER): Payer: Self-pay

## 2021-04-08 DIAGNOSIS — M25571 Pain in right ankle and joints of right foot: Secondary | ICD-10-CM

## 2021-04-08 MED ORDER — KETOROLAC TROMETHAMINE 30 MG/ML IJ SOLN
30.0000 mg | Freq: Once | INTRAMUSCULAR | Status: AC
  Administered 2021-04-08: 10:00:00 30 mg via INTRAMUSCULAR

## 2021-04-08 MED ORDER — MELOXICAM 15 MG PO TABS: 15.0000 mg | ORAL_TABLET | Freq: Every day | ORAL | 0 refills | Status: DC

## 2021-04-08 NOTE — ED Triage Notes (Signed)
Yesterday, while playing basketball outside, Pt notes that he "landed wrong" when he came down from a jump causing a sudden onset of right ankle pain and swelling. Pain in interfering with his sleep. Ambulating with a limp. Pain with elevation and ambulation. Denies right leg pain. No LLE pain. No meds taken.

## 2021-04-08 NOTE — Discharge Instructions (Addendum)
Try to stay off of your right foot is much as possible until you can follow-up with orthopedics for recheck and further recommendations/restrictions.  Keep your ankle and knee compression wrap such as Ace bandaging or a compression sleeve until then as well.  Use your crutches for walking.  Over-the-counter pain relievers, ice as needed.

## 2021-04-08 NOTE — ED Provider Notes (Signed)
[name]    CSN: [id number] Arrival date & time: 04/08/21  1607      History   Chief Complaint Chief Complaint  Patient presents with   Ankle Pain    right    HPI Kyle Kelly is a 25 y.o. male.   Patient presenting today with 1 day history of right lateral ankle pain after jumping while playing basketball and landing wrong on the foot.  Has had significant swelling and bruising to the area and is unable to bear weight.  Denies numbness, tingling, weakness, inability to move toes, radiation of pain up into the leg.  So far trying over-the-counter pain relievers with minimal relief.  Has injured this ankle several times in the past.   Past Medical History:  Diagnosis Date   Asthma    not recent    Patient Active Problem List   Diagnosis Date Noted   Acute bronchitis 05/28/2017   Respiratory failure (HCC) 05/27/2017    History reviewed. No pertinent surgical history.     Home Medications    Prior to Admission medications   Medication Sig Start Date End Date Taking? Authorizing Provider  meloxicam (MOBIC) 15 MG tablet Take 1 tablet (15 mg total) by mouth daily. Do not combine this medication with ibuprofen, Advil, or Aleve as they are similar medications 04/08/21  Yes Particia Nearing, PA-C  metroNIDAZOLE (FLAGYL) 500 MG tablet Take 1 tablet (500 mg total) by mouth 2 (two) times daily with a meal. DO NOT CONSUME ALCOHOL WHILE TAKING THIS MEDICATION. 01/31/21   Wallis Bamberg, PA-C    Family History Family History  Problem Relation Age of Onset   Asthma Mother    Healthy Father     Social History Social History   Tobacco Use   Smoking status: Every Day    Years: 4.00    Pack years: 0.00    Types: Cigarettes   Smokeless tobacco: Never  Vaping Use   Vaping Use: Never used  Substance Use Topics   Alcohol use: Not Currently   Drug use: Never     Allergies   Sulfonylureas, Elemental sulfur, and Sulfa antibiotics   Review of  Systems Review of Systems Per HPI  Physical Exam Triage Vital Signs ED Triage Vitals  Enc Vitals Group     BP 04/08/21 0947 127/85     Pulse Rate 04/08/21 0947 73     Resp 04/08/21 0947 18     Temp 04/08/21 0947 98 F (36.7 C)     Temp Source 04/08/21 0947 Oral     SpO2 04/08/21 0947 97 %     Weight --      Height --      Head Circumference --      Peak Flow --      Pain Score 04/08/21 0950 10     Pain Loc --      Pain Edu? --      Excl. in [place]? --    No data found.  Updated Vital Signs BP 127/85 (BP Location: Left Arm)   Pulse 73   Temp 98 F (36.7 C) (Oral)   Resp 18   SpO2 97%   Visual Acuity Right Eye Distance:   Left Eye Distance:   Bilateral Distance:    Right Eye Near:   Left Eye Near:    Bilateral Near:     Physical Exam Vitals and nursing note reviewed.  Constitutional:      Appearance: Normal appearance.  HENT:     Head: Atraumatic.  Eyes:     Extraocular Movements: Extraocular movements intact.     Conjunctiva/sclera: Conjunctivae normal.  Cardiovascular:     Rate and Rhythm: Normal rate and regular rhythm.  Pulmonary:     Effort: Pulmonary effort is normal.     Breath sounds: Normal breath sounds.  Musculoskeletal:        General: Swelling, tenderness and signs of injury present. Normal range of motion.     Cervical back: Normal range of motion and neck supple.     Comments: Localized edema, bruising, significant tenderness to palpation over lateral malleolus of right ankle.  Minimal range of motion at ankle but able to move all 5 toes on this foot.  Currently in wheelchair, ambulatory at baseline  Skin:    General: Skin is warm and dry.     Findings: Bruising present.  Neurological:     General: No focal deficit present.     Mental Status: He is oriented to person, place, and time.     Comments: Bilateral lower extremities neurovascularly intact  Psychiatric:        Mood and Affect: Mood normal.        Thought Content: Thought content  normal.        Judgment: Judgment normal.     UC Treatments / Results  Labs (all labs ordered are listed, but only abnormal results are displayed) Labs Reviewed - No data to display  EKG   Radiology DG Ankle Complete Right  Result Date: 04/08/2021 CLINICAL DATA:  Right ankle pain and swelling after basketball injury yesterday EXAM: RIGHT ANKLE - COMPLETE 3+ VIEW COMPARISON:  None. FINDINGS: Prominent lateral right ankle soft tissue swelling. No acute right ankle fracture or subluxation. Small well corticated osseous fragments adjacent to the inferior tip of the lateral malleolus. No radiopaque foreign bodies. No suspicious focal osseous lesions. IMPRESSION: 1. Prominent lateral right ankle soft tissue swelling, with no acute right ankle fracture or subluxation. 2. Small well corticated osseous fragments adjacent to the inferior tip of the lateral malleolus, suggesting old avulsion injury. Electronically Signed   By: Delbert Phenix M.D.   On: 04/08/2021 10:28    Procedures Procedures (including critical care time)  Medications Ordered in UC Medications  ketorolac (TORADOL) 30 MG/ML injection 30 mg (30 mg Intramuscular Given 04/08/21 1027)    Initial Impression / Assessment and Plan / UC Course  I have reviewed the triage vital signs and the nursing notes.  Pertinent labs & imaging results that were available during my care of the patient were reviewed by me and considered in my medical decision making (see chart for details).     X-ray right ankle today showing no acute abnormalities but an old healed avulsion injury to the lateral malleolus.  He does not recall knowingly breaking this ankle in the past.  Given the extent of his swelling and pain with weightbearing, will provide crutches and wrapped in Ace wrap.  RICE protocol, close orthopedic follow-up.  Over-the-counter pain relievers reviewed and will give a supply of meloxicam for pain.  Return for acutely worsening symptoms  Final  Clinical Impressions(s) / UC Diagnoses   Final diagnoses:  Acute right ankle pain     Discharge Instructions      Try to stay off of your right foot is much as possible until you can follow-up with orthopedics for recheck and further recommendations/restrictions.  Keep your ankle and knee compression wrap such as Ace  bandaging or a compression sleeve until then as well.  Use your crutches for walking.  Over-the-counter pain relievers, ice as needed.     ED Prescriptions     Medication Sig Dispense Auth. Provider   meloxicam (MOBIC) 15 MG tablet Take 1 tablet (15 mg total) by mouth daily. Do not combine this medication with ibuprofen, Advil, or Aleve as they are similar medications 15 tablet Particia Nearing, [place]      PDMP not reviewed this encounter.   Particia Nearing, [place] 04/08/21 1527

## 2021-05-24 ENCOUNTER — Ambulatory Visit: Payer: Self-pay | Admitting: Medical

## 2021-11-10 ENCOUNTER — Other Ambulatory Visit: Payer: Self-pay

## 2021-11-10 ENCOUNTER — Ambulatory Visit
Admission: EM | Admit: 2021-11-10 | Discharge: 2021-11-10 | Disposition: A | Discharge status: Home / Self Care | Payer: Medicaid Other | Attending: Physician Assistant | Admitting: Physician Assistant

## 2021-11-10 DIAGNOSIS — Z113 Encounter for screening for infections with a predominantly sexual mode of transmission: Secondary | ICD-10-CM | POA: Insufficient documentation

## 2021-11-10 DIAGNOSIS — R300 Dysuria: Secondary | ICD-10-CM

## 2021-11-10 LAB — POCT URINALYSIS DIP (MANUAL ENTRY)
Bilirubin, UA: NEGATIVE
Blood, UA: NEGATIVE
Glucose, UA: NEGATIVE mg/dL
Ketones, POC UA: NEGATIVE mg/dL
Nitrite, UA: NEGATIVE
Protein Ur, POC: NEGATIVE mg/dL
Spec Grav, UA: >=1.03 — AB (ref 1.010–1.025)
Urobilinogen, UA: 0.2 E.U./dL
pH, UA: 5.5 (ref 5.0–8.0)

## 2021-11-10 NOTE — ED Provider Notes (Signed)
[name]    CSN: [id number] Arrival date & time: 11/10/21  1543      History   Chief Complaint Chief Complaint  Patient presents with   Dysuria    HPI Kyle Kelly is a 26 y.o. male.   Patient here today for evaluation of dysuria he has had the last week. He is unsure if he has STD or if his symptoms are related to a new pre-workout he is taking. He has not had any hematuria , urinary urgency or frequency. He denies any penile discharge.   The history is provided by the patient.  Dysuria Presenting symptoms: dysuria   Associated symptoms: no fever, no hematuria and no urinary frequency    Past Medical History:  Diagnosis Date   Asthma    not recent    Patient Active Problem List   Diagnosis Date Noted   Acute bronchitis 05/28/2017   Respiratory failure ([place]) 05/27/2017    History reviewed. No pertinent surgical history.     Home Medications    Prior to Admission medications   Medication Sig Start Date End Date Taking? Authorizing Provider  meloxicam (MOBIC) 15 MG tablet Take 1 tablet (15 mg total) by mouth daily. Do not combine this medication with ibuprofen, Advil, or Aleve as they are similar medications 04/08/21   Volney American, PA-C  metroNIDAZOLE (FLAGYL) 500 MG tablet Take 1 tablet (500 mg total) by mouth 2 (two) times daily with a meal. DO NOT CONSUME ALCOHOL WHILE TAKING THIS MEDICATION. 01/31/21   Jaynee Eagles, PA-C    Family History Family History  Problem Relation Age of Onset   Asthma Mother    Healthy Father     Social History Social History   Tobacco Use   Smoking status: Every Day    Years: 4.00    Types: Cigarettes   Smokeless tobacco: Never  Vaping Use   Vaping Use: Never used  Substance Use Topics   Alcohol use: Not Currently   Drug use: Never     Allergies   Sulfonylureas, Elemental sulfur, and Sulfa antibiotics   Review of Systems Review of Systems  Constitutional:  Negative for chills and  fever.  Eyes:  Negative for discharge and redness.  Respiratory:  Negative for shortness of breath.   Genitourinary:  Positive for dysuria. Negative for frequency, hematuria and urgency.    Physical Exam Triage Vital Signs ED Triage Vitals  Enc Vitals Group     BP 11/10/21 1639 (!) 149/71     Pulse Rate 11/10/21 1639 72     Resp 11/10/21 1639 18     Temp 11/10/21 1639 97.9 F (36.6 C)     Temp Source 11/10/21 1639 Oral     SpO2 11/10/21 1639 94 %     Weight --      Height --      Head Circumference --      Peak Flow --      Pain Score 11/10/21 1641 8     Pain Loc --      Pain Edu? --      Excl. in [place]? --    No data found.  Updated Vital Signs BP (!) 149/71 (BP Location: Right Arm)    Pulse 72    Temp 97.9 F (36.6 C) (Oral)    Resp 18    SpO2 94%     Physical Exam Vitals and nursing note reviewed.  Constitutional:      General: He  is not in acute distress.    Appearance: Normal appearance. He is not ill-appearing.  HENT:     Head: Normocephalic and atraumatic.  Eyes:     Conjunctiva/sclera: Conjunctivae normal.  Cardiovascular:     Rate and Rhythm: Normal rate.  Pulmonary:     Effort: Pulmonary effort is normal.  Neurological:     Mental Status: He is alert.  Psychiatric:        Mood and Affect: Mood normal.        Behavior: Behavior normal.     UC Treatments / Results  Labs (all labs ordered are listed, but only abnormal results are displayed) Labs Reviewed  POCT URINALYSIS DIP (MANUAL ENTRY) - Abnormal; Notable for the following components:      Result Value   Spec Grav, UA >=1.030 (*)    Leukocytes, UA Trace (*)    All other components within normal limits  CYTOLOGY, (ORAL, ANAL, URETHRAL) ANCILLARY ONLY    EKG   Radiology No results found.  Procedures Procedures (including critical care time)  Medications Ordered in UC Medications - No data to display  Initial Impression / Assessment and Plan / UC Course  I have reviewed the triage  vital signs and the nursing notes.  Pertinent labs & imaging results that were available during my care of the patient were reviewed by me and considered in my medical decision making (see chart for details).    UA without sign of UTI, will order STD screening. Will await results for further recommendations. Advised against sexual intercourse until results are available for review.   Final Clinical Impressions(s) / UC Diagnoses   Final diagnoses:  Screening for STD (sexually transmitted disease)   Discharge Instructions   None    ED Prescriptions   None    PDMP not reviewed this encounter.   Francene Finders, PA-C 11/10/21 1721

## 2021-11-10 NOTE — ED Triage Notes (Signed)
One week h/o dysuria. Pt says that he is uncertain if he has  STD or if sxs are coming from using a new pre-workout. Denies hematuria, urinary urgency and frequency. No penile discharge.

## 2021-11-11 ENCOUNTER — Telehealth: Payer: Medicaid Other | Admitting: Physician Assistant

## 2021-11-11 DIAGNOSIS — R369 Urethral discharge, unspecified: Secondary | ICD-10-CM

## 2021-11-11 LAB — CYTOLOGY, (ORAL, ANAL, URETHRAL) ANCILLARY ONLY
Chlamydia: NEGATIVE
Comment: NEGATIVE
Comment: NEGATIVE
Comment: NORMAL
Neisseria Gonorrhea: NEGATIVE
Trichomonas: NEGATIVE

## 2021-11-11 NOTE — Progress Notes (Signed)
E-Visit for Urinary Problems  Based on what you shared with me, I feel your condition warrants further evaluation and I recommend that you be seen for a face to face office visit.  Male bladder infections are not very common.  We worry about prostate or kidney conditions.  The standard of care is to examine the abdomen and kidneys, and to do a urine and blood test to make sure that something more serious is not going on.  We recommend that you see a provider today.  If your doctor's office is closed [facility] has the following Urgent Cares:  *Of note: we do not treat male UTIs through the virtual platform due to multiple sources. I do see that you were seen yesterday and your urine appears to be normal. Your other testing is still pending. I would recommend to continue to push fluids and use tylenol as needed for any pain/discomfort until your other test results come in.    NOTE: You will not be charged for this e-visit.  If you are having a true medical emergency please call 911.       For an urgent face to face visit, [facility] has six urgent care centers for your convenience:     [facility] at [facility] Directions [phone] [address] [place], [place] [address]    [facility] Center [facility]) Get Driving Directions [phone] [address] [place], [place] [address]  [facility] Center [facility]) Get Driving Directions [phone] [address] [place],  [place]  [address]  [facility]gent Care at [facility] Get Driving Directions [phone] [address] [place], [place] [address]   [facility] Health Urgent Care at [facility] Get Driving Directions  [phone] [address] [place], [place] [address]   [facility] Health Urgent Care at [facility] Directions [phone] [address] [place], [place] [address]  Your MyChart E-visit questionnaire answers were reviewed by a board certified advanced clinical practitioner to complete your personal care plan based on your specific symptoms.  Thank you for using e-Visits.   I provided 5 minutes of non face-to-face time during this encounter for chart review and documentation.

## 2021-11-12 ENCOUNTER — Other Ambulatory Visit: Payer: Self-pay

## 2021-11-12 ENCOUNTER — Ambulatory Visit
Admission: EM | Admit: 2021-11-12 | Discharge: 2021-11-12 | Disposition: A | Discharge status: Home / Self Care | Payer: Medicaid Other | Attending: Internal Medicine | Admitting: Internal Medicine

## 2021-11-12 DIAGNOSIS — R300 Dysuria: Secondary | ICD-10-CM | POA: Insufficient documentation

## 2021-11-12 DIAGNOSIS — R369 Urethral discharge, unspecified: Secondary | ICD-10-CM | POA: Insufficient documentation

## 2021-11-12 DIAGNOSIS — Z113 Encounter for screening for infections with a predominantly sexual mode of transmission: Secondary | ICD-10-CM | POA: Insufficient documentation

## 2021-11-12 LAB — POCT URINALYSIS DIP (MANUAL ENTRY)
Bilirubin, UA: NEGATIVE
Glucose, UA: NEGATIVE mg/dL
Ketones, POC UA: NEGATIVE mg/dL
Nitrite, UA: NEGATIVE
Spec Grav, UA: >=1.03 — AB (ref 1.010–1.025)
Urobilinogen, UA: 0.2 E.U./dL
pH, UA: 6 (ref 5.0–8.0)

## 2021-11-12 MED ORDER — CEFTRIAXONE SODIUM 500 MG IJ SOLR
500.0000 mg | Freq: Once | INTRAMUSCULAR | Status: AC
  Administered 2021-11-12: 500 mg via INTRAMUSCULAR

## 2021-11-12 NOTE — Discharge Instructions (Addendum)
Your penile swab is pending.  Urine culture is pending.  You have been given an antibiotic injection today to help alleviate your symptoms.  Please refrain from sexual activity until test results and treatment are complete.

## 2021-11-12 NOTE — ED Triage Notes (Signed)
Pt c/o penile discharge over a week w/ associated dysuria. Seen at this clinic but tested (-) for sti. In triage pt states unsure if he got a good sample. Here for reeval.

## 2021-11-12 NOTE — ED Provider Notes (Signed)
[name]    CSN: [id number] Arrival date & time: 11/12/21  6387      History   Chief Complaint Chief Complaint  Patient presents with   Penile Discharge    HPI Kyle Kelly is a 26 y.o. male.   Patient presents with yellow penile discharge and urinary burning that has been present for approximately 1 week.  He was seen on 11/10/2021 and had negative cytology and unremarkable urinalysis.  He reports that symptoms have worsened and have been persistent.  Denies pelvic pain, testicular pain, urinary frequency, back pain, fever.  Denies any known exposure to STD but reports that he has had gonorrhea before and this "feels similar".   Penile Discharge   Past Medical History:  Diagnosis Date   Asthma    not recent    Patient Active Problem List   Diagnosis Date Noted   Acute bronchitis 05/28/2017   Respiratory failure (HCC) 05/27/2017    History reviewed. No pertinent surgical history.     Home Medications    Prior to Admission medications   Medication Sig Start Date End Date Taking? Authorizing Provider  meloxicam (MOBIC) 15 MG tablet Take 1 tablet (15 mg total) by mouth daily. Do not combine this medication with ibuprofen, Advil, or Aleve as they are similar medications 04/08/21   Particia Nearing, PA-C  metroNIDAZOLE (FLAGYL) 500 MG tablet Take 1 tablet (500 mg total) by mouth 2 (two) times daily with a meal. DO NOT CONSUME ALCOHOL WHILE TAKING THIS MEDICATION. 01/31/21   Wallis Bamberg, PA-C    Family History Family History  Problem Relation Age of Onset   Asthma Mother    Healthy Father     Social History Social History   Tobacco Use   Smoking status: Every Day    Years: 4.00    Types: Cigarettes   Smokeless tobacco: Never  Vaping Use   Vaping Use: Never used  Substance Use Topics   Alcohol use: Not Currently   Drug use: Never     Allergies   Sulfonylureas, Elemental sulfur, and Sulfa antibiotics   Review of Systems Review  of Systems Per HPI  Physical Exam Triage Vital Signs ED Triage Vitals  Enc Vitals Group     BP 11/12/21 0836 (!) 146/75     Pulse Rate 11/12/21 0836 83     Resp 11/12/21 0836 18     Temp 11/12/21 0836 97.8 F (36.6 C)     Temp Source 11/12/21 0836 Oral     SpO2 11/12/21 0836 98 %     Weight --      Height --      Head Circumference --      Peak Flow --      Pain Score 11/12/21 0837 0     Pain Loc --      Pain Edu? --      Excl. in [place]? --    No data found.  Updated Vital Signs BP (!) 146/75 (BP Location: Left Arm)    Pulse 83    Temp 97.8 F (36.6 C) (Oral)    Resp 18    SpO2 98%   Visual Acuity Right Eye Distance:   Left Eye Distance:   Bilateral Distance:    Right Eye Near:   Left Eye Near:    Bilateral Near:     Physical Exam Exam conducted with a chaperone present.  Constitutional:      General: He is not in acute  distress.    Appearance: Normal appearance. He is not toxic-appearing or diaphoretic.  HENT:     Head: Normocephalic and atraumatic.  Eyes:     Extraocular Movements: Extraocular movements intact.     Conjunctiva/sclera: Conjunctivae normal.  Pulmonary:     Effort: Pulmonary effort is normal.  Genitourinary:    Penis: Circumcised. No erythema, tenderness, discharge, swelling or lesions.      Testes: Normal. Cremasteric reflex is present.  Neurological:     General: No focal deficit present.     Mental Status: He is alert and oriented to person, place, and time. Mental status is at baseline.  Psychiatric:        Mood and Affect: Mood normal.        Behavior: Behavior normal.        Thought Content: Thought content normal.        Judgment: Judgment normal.     UC Treatments / Results  Labs (all labs ordered are listed, but only abnormal results are displayed) Labs Reviewed  POCT URINALYSIS DIP (MANUAL ENTRY) - Abnormal; Notable for the following components:      Result Value   Clarity, UA hazy (*)    Spec Grav, UA >=1.030 (*)     Blood, UA trace-intact (*)    Protein Ur, POC trace (*)    Leukocytes, UA Moderate (2+) (*)    All other components within normal limits  URINE CULTURE  CYTOLOGY, (ORAL, ANAL, URETHRAL) ANCILLARY ONLY    EKG   Radiology No results found.  Procedures Procedures (including critical care time)  Medications Ordered in UC Medications  cefTRIAXone (ROCEPHIN) injection 500 mg (500 mg Intramuscular Given 11/12/21 0901)    Initial Impression / Assessment and Plan / UC Course  I have reviewed the triage vital signs and the nursing notes.  Pertinent labs & imaging results that were available during my care of the patient were reviewed by me and considered in my medical decision making (see chart for details).     Patient's symptoms indicating possible STD.  Urinalysis showing moderate leukocytes but suspicious of STD given penile discharge that is present on exam.  Patient treated with IM Rocephin in urgent care today due to suspicion of gonorrhea.  Low suspicion for urinary tract infection.  Will await urine culture and cytology swabs for further or additional treatment.  Patient advised to refrain from sexual activity until test results and treatment are complete.  Patient verbalized understanding and was agreeable with plan. Final Clinical Impressions(s) / UC Diagnoses   Final diagnoses:  Dysuria  Penile discharge  Screening examination for venereal disease     Discharge Instructions      Your penile swab is pending.  Urine culture is pending.  You have been given an antibiotic injection today to help alleviate your symptoms.  Please refrain from sexual activity until test results and treatment are complete.    ED Prescriptions   None    PDMP not reviewed this encounter.   Gustavus Bryant, [place] 11/12/21 [phone]

## 2021-11-13 LAB — URINE CULTURE: Culture: NO GROWTH

## 2021-11-15 LAB — CYTOLOGY, (ORAL, ANAL, URETHRAL) ANCILLARY ONLY
Chlamydia: NEGATIVE
Comment: NEGATIVE
Comment: NEGATIVE
Comment: NORMAL
Neisseria Gonorrhea: NEGATIVE
Trichomonas: NEGATIVE

## 2022-02-09 ENCOUNTER — Encounter (HOSPITAL_COMMUNITY): Payer: Self-pay

## 2022-02-09 ENCOUNTER — Emergency Department (HOSPITAL_COMMUNITY)
Admission: EM | Admit: 2022-02-09 | Discharge: 2022-02-09 | Disposition: A | Discharge status: Home / Self Care | Payer: Medicaid Other | Attending: Emergency Medicine | Admitting: Emergency Medicine

## 2022-02-09 DIAGNOSIS — K0889 Other specified disorders of teeth and supporting structures: Secondary | ICD-10-CM

## 2022-02-09 DIAGNOSIS — K047 Periapical abscess without sinus: Secondary | ICD-10-CM

## 2022-02-09 MED ORDER — AMOXICILLIN 500 MG PO CAPS: 500.0000 mg | ORAL_CAPSULE | Freq: Three times a day (TID) | ORAL | 0 refills | Status: DC

## 2022-02-09 NOTE — Discharge Instructions (Signed)
Please pick up prescription and take as prescribed to cover for infection.  ? ?You can take 800 mg Ibuprofen (4 OTC tablets) every 8 hours as needed for pain and 1,000 mg Tylenol (2 OTC double strength tablets) every 8 hours as needed for pain. Alternate each medication every 4 hours to stay on top of the pain.  ? ?Follow up with Dr. Norwood Levo who is our dentist on call.  ?Attached is a list of additional dental resources in the area.  ? ?Return to the ED for any new/worsening symptoms  ?

## 2022-02-09 NOTE — ED Triage Notes (Signed)
C/o right upper dental pain. Reports 2 open cavities X6 months. Reports part of it chipped off 3 days ago and worse pain.  ? ?A/Ox4 ?

## 2022-02-09 NOTE — ED Provider Notes (Signed)
?[facility]-EMERGENCY DEPT ?Provider Note ? ? ?CSN: [id number] ?Arrival date & time: 02/09/22  1748 ? ?  ? ?History ? ?Chief Complaint  ?Patient presents with  ? Dental Pain  ? ? ?Kyle Kelly is a 26 y.o. male who presents to the ED today with complaint of gradual onset, constant, sharp, right upper dental pain.  Patient states he has been dealing with it for about 6 months however last week a smaller portion of the tooth chipped off causing worsening pain.  He has been taking Tylenol without relief.  He does not have a dentist in the area.  He denies any fevers or chills or facial swelling. ? ?The history is provided by the patient and medical records.  ? ?  ? ?Home Medications ?Prior to Admission medications   ?Medication Sig Start Date End Date Taking? Authorizing Provider  ?amoxicillin (AMOXIL) 500 MG capsule Take 1 capsule (500 mg total) by mouth 3 (three) times daily. 02/09/22  Yes Zariana Strub, PA-C  ?meloxicam (MOBIC) 15 MG tablet Take 1 tablet (15 mg total) by mouth daily. Do not combine this medication with ibuprofen, Advil, or Aleve as they are similar medications 04/08/21   Particia Nearing, PA-C  ?metroNIDAZOLE (FLAGYL) 500 MG tablet Take 1 tablet (500 mg total) by mouth 2 (two) times daily with a meal. DO NOT CONSUME ALCOHOL WHILE TAKING THIS MEDICATION. 01/31/21   Wallis Bamberg, PA-C  ?   ? ?Allergies    ?Sulfonylureas, Elemental sulfur, and Sulfa antibiotics   ? ?Review of Systems   ?Review of Systems  ?Constitutional:  Negative for chills and fever.  ?HENT:  Positive for dental problem. Negative for facial swelling.   ?All other systems reviewed and are negative. ? ?Physical Exam ?Updated Vital Signs ?BP (!) 152/98 (BP Location: Left Arm)   Pulse 92   Temp 98.9 ?F (37.2 ?C) (Oral)   Resp 16   SpO2 95%  ?Physical Exam ?Vitals and nursing note reviewed.  ?Constitutional:   ?   Appearance: He is not ill-appearing.  ?HENT:  ?   Head: Normocephalic and atraumatic.  ?    Mouth/Throat:  ?   Comments: Nose clear.  ?R upper tooth #1 decayed/fractured with caries with TTP, with minimal surrounding gingival swelling and erythema, no definite abscess, no evidence of ludwig's.  ?Oropharynx clear and moist, without uvular swelling or deviation, no trismus or drooling, no tonsillar swelling or erythema, no exudates.   ?Eyes:  ?   Conjunctiva/sclera: Conjunctivae normal.  ?Cardiovascular:  ?   Rate and Rhythm: Normal rate and regular rhythm.  ?Pulmonary:  ?   Effort: Pulmonary effort is normal.  ?   Breath sounds: Normal breath sounds.  ?Skin: ?   General: Skin is warm and dry.  ?   Coloration: Skin is not jaundiced.  ?Neurological:  ?   Mental Status: He is alert.  ? ? ?ED Results / Procedures / Treatments   ?Labs ?(all labs ordered are listed, but only abnormal results are displayed) ?Labs Reviewed - No data to display ? ?EKG ?None ? ?Radiology ?No results found. ? ?Procedures ?Procedures  ? ? ?Medications Ordered in ED ?Medications - No data to display ? ?ED Course/ Medical Decision Making/ A&P ?  ?                        ?Medical Decision Making ?26 year old male who presents to the ED today for right upper dental pain x6  months, worsening over the past week.  Does not have dentist who he follows with.  On arrival to the ED vitals are stable.  Patient appears to be in no acute distress.  He is noted to have fractured, decayed tooth #1 on the right upper side with mild gingival erythema.  No definitive abscess to be drained.  No concern for Ludwig's angina.  Patient to be discharged home with amoxicillin and dental follow-up.  He is in agreement with plan at this time.  Stable for discharge. ? ?Problems Addressed: ?Dental infection: acute illness or injury ?Pain, dental: acute illness or injury ? ? ? ? ? ? ? ? ? ? ?Final Clinical Impression(s) / ED Diagnoses ?Final diagnoses:  ?Pain, dental  ?Dental infection  ? ? ?Rx / DC Orders ?ED Discharge Orders   ? ?      Ordered  ?  amoxicillin  (AMOXIL) 500 MG capsule  3 times daily       ? 02/09/22 1843  ? ?  ?  ? ?  ? ? ? ?Discharge Instructions   ? ?  ?Please pick up prescription and take as prescribed to cover for infection.  ? ?You can take 800 mg Ibuprofen (4 OTC tablets) every 8 hours as needed for pain and 1,000 mg Tylenol (2 OTC double strength tablets) every 8 hours as needed for pain. Alternate each medication every 4 hours to stay on top of the pain.  ? ?Follow up with Dr. Norwood Levo who is our dentist on call.  ?Attached is a list of additional dental resources in the area.  ? ?Return to the ED for any new/worsening symptoms  ? ? ? ? ?  ?Tanda Rockers, PA-C ?02/09/22 1844 ? ?  ?Sloan Leiter, DO ?02/10/22 1744 ? ?

## 2022-09-21 ENCOUNTER — Ambulatory Visit: Admission: EM | Admit: 2022-09-21 | Discharge: 2022-09-21 | Discharge status: Against Medical Advice | Payer: Self-pay

## 2022-09-26 ENCOUNTER — Encounter: Payer: Self-pay | Admitting: Emergency Medicine

## 2022-09-26 ENCOUNTER — Ambulatory Visit (HOSPITAL_COMMUNITY): Admit: 2022-09-26 | Payer: Medicaid Other

## 2022-09-26 ENCOUNTER — Ambulatory Visit
Admission: EM | Admit: 2022-09-26 | Discharge: 2022-09-26 | Disposition: A | Discharge status: Home / Self Care | Payer: Self-pay

## 2022-09-26 DIAGNOSIS — Z113 Encounter for screening for infections with a predominantly sexual mode of transmission: Secondary | ICD-10-CM

## 2022-09-26 DIAGNOSIS — Z202 Contact with and (suspected) exposure to infections with a predominantly sexual mode of transmission: Secondary | ICD-10-CM

## 2022-09-26 MED ORDER — DOXYCYCLINE HYCLATE 100 MG PO CAPS: 100.0000 mg | ORAL_CAPSULE | Freq: Two times a day (BID) | ORAL | 0 refills | Status: DC

## 2022-09-26 NOTE — ED Provider Notes (Signed)
[name]    CSN: [id number] Arrival date & time: 09/26/22  1352      History   Chief Complaint Chief Complaint  Patient presents with   Exposure to STD    HPI Kyle Kelly is a 26 y.o. male.   Patient presents today for STD testing.  Patient reports that he was exposed to chlamydia from an unprotected sexual encounter approximately 2 weeks ago.  He denies any associated symptoms including penile discharge, dysuria, urinary frequency, testicular pain, back pain, abdominal pain, fever.  Reports this is the first time that he has been evaluated for this since exposure.  He does not want HIV or syphilis testing.   Exposure to STD    Past Medical History:  Diagnosis Date   Asthma    not recent    Patient Active Problem List   Diagnosis Date Noted   Acute bronchitis 05/28/2017   Respiratory failure (HCC) 05/27/2017    History reviewed. No pertinent surgical history.     Home Medications    Prior to Admission medications   Medication Sig Start Date End Date Taking? Authorizing Provider  doxycycline (VIBRAMYCIN) 100 MG capsule Take 1 capsule (100 mg total) by mouth 2 (two) times daily. 09/26/22  Yes Luise Yamamoto, Rolly Salter E, FNP  lisdexamfetamine (VYVANSE) 60 MG capsule Take by mouth.    [provider]  meloxicam (MOBIC) 15 MG tablet Take 1 tablet (15 mg total) by mouth daily. Do not combine this medication with ibuprofen, Advil, or Aleve as they are similar medications 04/08/21   Particia Nearing, PA-C    Family History Family History  Problem Relation Age of Onset   Asthma Mother    Healthy Father     Social History Social History   Tobacco Use   Smoking status: Every Day    Years: 4.00    Types: Cigarettes   Smokeless tobacco: Never  Vaping Use   Vaping Use: Never used  Substance Use Topics   Alcohol use: Not Currently   Drug use: Never     Allergies   Sulfonylureas, Elemental sulfur, and Sulfa antibiotics   Review of  Systems Review of Systems Per HPI  Physical Exam Triage Vital Signs ED Triage Vitals  Enc Vitals Group     BP 09/26/22 1640 114/87     Pulse Rate 09/26/22 1640 79     Resp 09/26/22 1640 18     Temp 09/26/22 1640 98 F (36.7 C)     Temp src --      SpO2 09/26/22 1640 97 %     Weight --      Height --      Head Circumference --      Peak Flow --      Pain Score 09/26/22 1639 0     Pain Loc --      Pain Edu? --      Excl. in GC? --    No data found.  Updated Vital Signs BP 114/87   Pulse 79   Temp 98 F (36.7 C)   Resp 18   SpO2 97%   Visual Acuity Right Eye Distance:   Left Eye Distance:   Bilateral Distance:    Right Eye Near:   Left Eye Near:    Bilateral Near:     Physical Exam Constitutional:      General: He is not in acute distress.    Appearance: Normal appearance. He is not toxic-appearing or diaphoretic.  HENT:     Head: Normocephalic and atraumatic.  Eyes:     Extraocular Movements: Extraocular movements intact.     Conjunctiva/sclera: Conjunctivae normal.  Pulmonary:     Effort: Pulmonary effort is normal.  Genitourinary:    Comments: Deferred with shared decision making.  Self swab performed. Neurological:     General: No focal deficit present.     Mental Status: He is alert and oriented to person, place, and time. Mental status is at baseline.  Psychiatric:        Mood and Affect: Mood normal.        Behavior: Behavior normal.        Thought Content: Thought content normal.        Judgment: Judgment normal.      UC Treatments / Results  Labs (all labs ordered are listed, but only abnormal results are displayed) Labs Reviewed  CYTOLOGY, (ORAL, ANAL, URETHRAL) ANCILLARY ONLY    EKG   Radiology No results found.  Procedures Procedures (including critical care time)  Medications Ordered in UC Medications - No data to display  Initial Impression / Assessment and Plan / UC Course  I have reviewed the triage vital signs and  the nursing notes.  Pertinent labs & imaging results that were available during my care of the patient were reviewed by me and considered in my medical decision making (see chart for details).     Given exposure to chlamydia, will opt to prophylactically treat with doxycycline antibiotic.  Cytology swab pending.  Will await results for any further treatment.  Patient declined HIV and syphilis testing.  Advised patient to refrain from sexual activity until test results and treatment are complete.  Discussed safe sex practices.  Discussed return precautions.  Patient verbalized understanding and was agreeable with plan. Final Clinical Impressions(s) / UC Diagnoses   Final diagnoses:  Exposure to chlamydia  Screening examination for venereal disease     Discharge Instructions      I have prescribed you an antibiotic due to chlamydia exposure.  Your penile swab is pending.  We will call if it is positive.  Please refrain from sexual activity until test results and treatment are complete.    ED Prescriptions     Medication Sig Dispense Auth. Provider   doxycycline (VIBRAMYCIN) 100 MG capsule Take 1 capsule (100 mg total) by mouth 2 (two) times daily. 20 capsule Gustavus Bryant, [place]      PDMP not reviewed this encounter.   Gustavus Bryant, [place] 09/26/22 [phone]

## 2022-09-26 NOTE — ED Triage Notes (Signed)
Pt is present today with exposure to STD x2 weeks ago. Pt states that he was exposed to chlamydia

## 2022-09-26 NOTE — Discharge Instructions (Signed)
I have prescribed you an antibiotic due to chlamydia exposure.  Your penile swab is pending.  We will call if it is positive.  Please refrain from sexual activity until test results and treatment are complete.

## 2022-09-27 ENCOUNTER — Telehealth (HOSPITAL_COMMUNITY): Payer: Self-pay | Admitting: Emergency Medicine

## 2022-09-27 LAB — CYTOLOGY, (ORAL, ANAL, URETHRAL) ANCILLARY ONLY
Chlamydia: NEGATIVE
Comment: NEGATIVE
Comment: NEGATIVE
Comment: NORMAL
Neisseria Gonorrhea: NEGATIVE
Trichomonas: POSITIVE — AB

## 2022-09-27 MED ORDER — METRONIDAZOLE 500 MG PO TABS: 2000.0000 mg | ORAL_TABLET | Freq: Once | ORAL | 0 refills | Status: AC

## 2023-03-13 IMAGING — DX DG ANKLE COMPLETE 3+V*R*
3 series · 3 of 3 positions shown · non-contrast
Comparison: None.

CLINICAL DATA: Right ankle pain and swelling after basketball
injury yesterday

EXAM:
RIGHT ANKLE - COMPLETE 3+ VIEW

[foot supine lat]
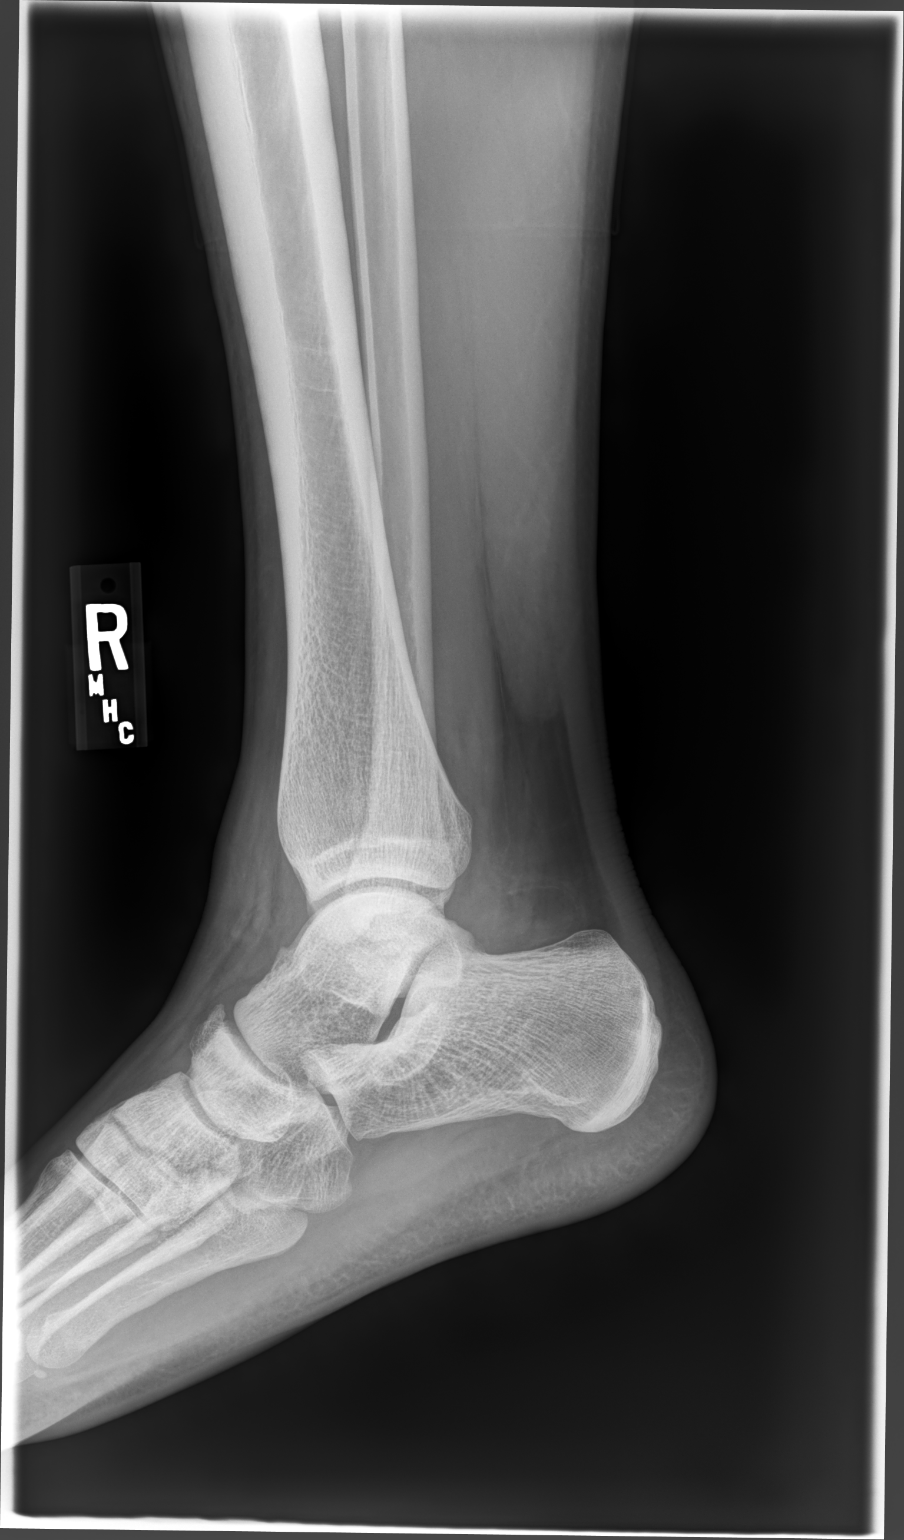

[ankle ap]
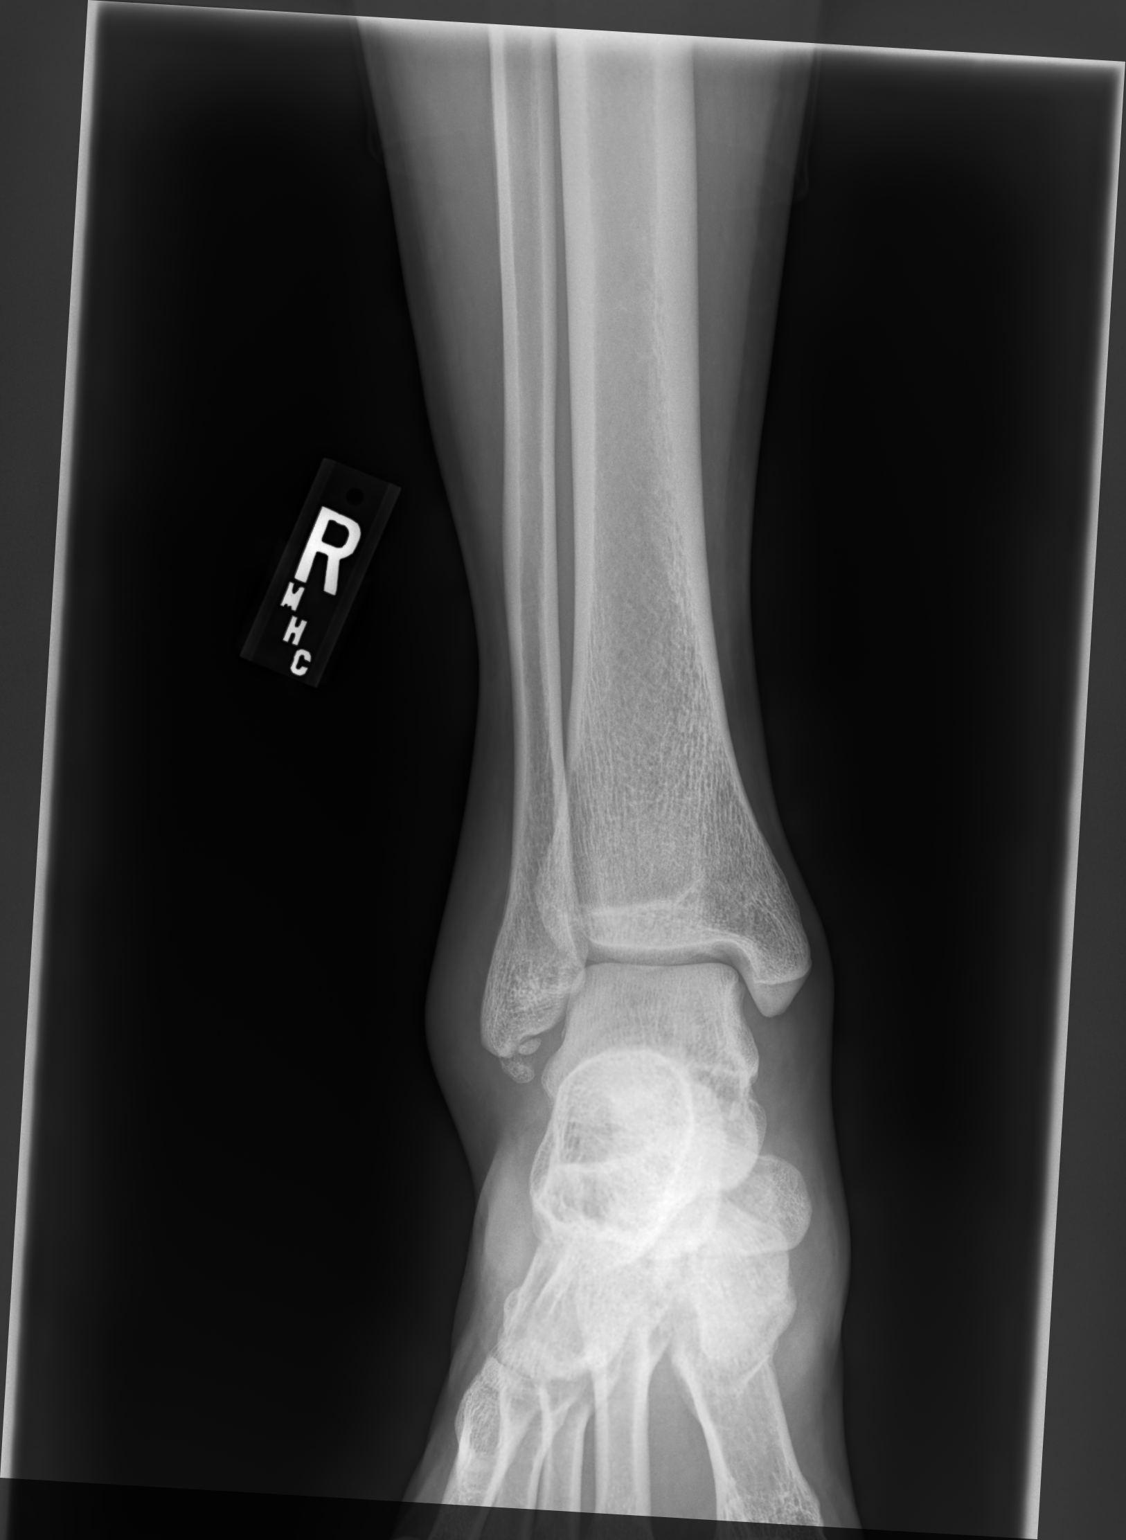

[ankle medial oblique]
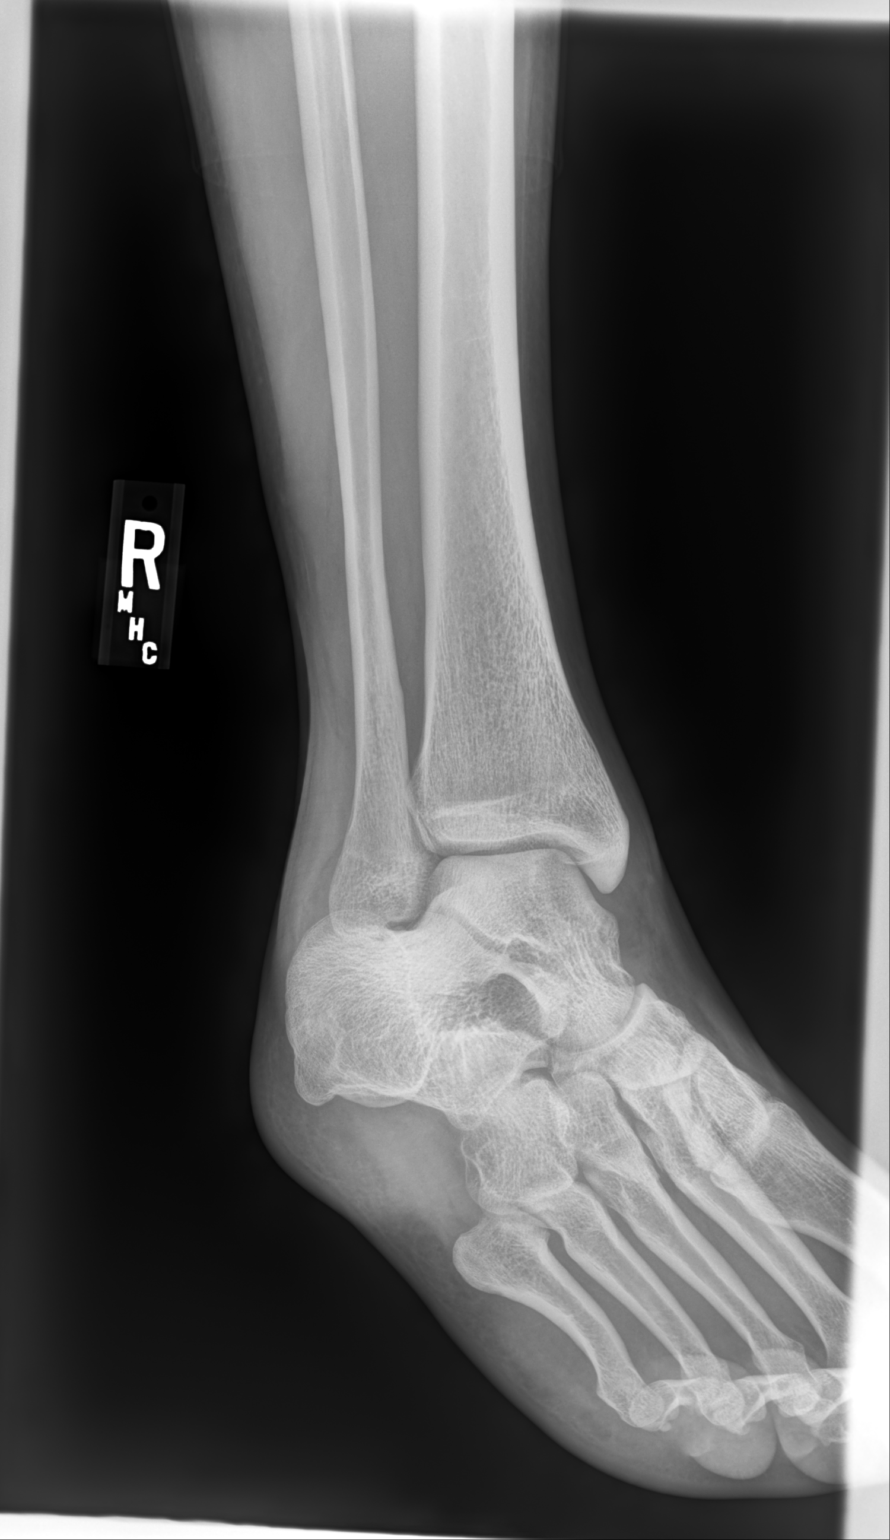

[3 of 3 positions shown; findings below may reference images not displayed]

FINDINGS: Prominent lateral right ankle soft tissue swelling. No acute right
ankle fracture or subluxation. Small well corticated osseous
fragments adjacent to the inferior tip of the lateral malleolus. No
radiopaque foreign bodies. No suspicious focal osseous lesions.
IMPRESSION: 1. Prominent lateral right ankle soft tissue swelling, with no acute
right ankle fracture or subluxation.
2. Small well corticated osseous fragments adjacent to the inferior
tip of the lateral malleolus, suggesting old avulsion injury.

## 2023-07-04 ENCOUNTER — Ambulatory Visit
Admission: EM | Admit: 2023-07-04 | Discharge: 2023-07-04 | Disposition: A | Discharge status: Home / Self Care | Payer: Medicaid Other | Attending: Nurse Practitioner | Admitting: Nurse Practitioner

## 2023-07-04 DIAGNOSIS — Z113 Encounter for screening for infections with a predominantly sexual mode of transmission: Secondary | ICD-10-CM | POA: Insufficient documentation

## 2023-07-04 NOTE — Discharge Instructions (Signed)
We are screening you today for gonorrhea, chlamydia, trichomonas, HIV, and syphilis.  Will contact you anything is positive.  Recommend condom use with every sexual encounter to prevent STI.

## 2023-07-04 NOTE — ED Triage Notes (Signed)
Pt presents for STD's test. Denies any signs and symptoms.

## 2023-07-04 NOTE — ED Provider Notes (Signed)
[name]    CSN: [id number] Arrival date & time: 07/04/23  1124      History   Chief Complaint Chief Complaint  Patient presents with   Exposure to STD    HPI Kyle Kelly is a 27 y.o. male.   Patient presents today for STD screening.  He denies penile discharge, penile sores, rashes, or lesions.  No swelling in the groin, dysuria, abdominal or pelvic pain.  He does have a area on the penis he wants me to look at that may be a hair bump.  No fever or nausea/vomiting.  No known exposures to STI.    Past Medical History:  Diagnosis Date   Asthma    not recent    Patient Active Problem List   Diagnosis Date Noted   Acute bronchitis 05/28/2017   Respiratory failure (HCC) 05/27/2017    History reviewed. No pertinent surgical history.     Home Medications    Prior to Admission medications   Medication Sig Start Date End Date Taking? Authorizing Provider  lisdexamfetamine (VYVANSE) 60 MG capsule Take by mouth.    [provider]  meloxicam (MOBIC) 15 MG tablet Take 1 tablet (15 mg total) by mouth daily. Do not combine this medication with ibuprofen, Advil, or Aleve as they are similar medications 04/08/21   Particia Nearing, PA-C    Family History Family History  Problem Relation Age of Onset   Asthma Mother    Healthy Father     Social History Social History   Tobacco Use   Smoking status: Every Day    Types: Cigarettes   Smokeless tobacco: Never  Vaping Use   Vaping status: Never Used  Substance Use Topics   Alcohol use: Not Currently   Drug use: Never     Allergies   Sulfonylureas, Elemental sulfur, and Sulfa antibiotics   Review of Systems Review of Systems Per HPI  Physical Exam Triage Vital Signs ED Triage Vitals  Encounter Vitals Group     BP 07/04/23 1133 126/74     Systolic BP Percentile --      Diastolic BP Percentile --      Pulse Rate 07/04/23 1133 62     Resp 07/04/23 1133 16     Temp 07/04/23  1133 98.8 F (37.1 C)     Temp Source 07/04/23 1133 Oral     SpO2 07/04/23 1133 96 %     Weight --      Height --      Head Circumference --      Peak Flow --      Pain Score 07/04/23 1135 0     Pain Loc --      Pain Education --      Exclude from Growth Chart --    No data found.  Updated Vital Signs BP 126/74 (BP Location: Left Arm)   Pulse 62   Temp 98.8 F (37.1 C) (Oral)   Resp 16   SpO2 96%   Visual Acuity Right Eye Distance:   Left Eye Distance:   Bilateral Distance:    Right Eye Near:   Left Eye Near:    Bilateral Near:     Physical Exam Vitals and nursing note reviewed.  Constitutional:      General: He is not in acute distress.    Appearance: Normal appearance. He is not toxic-appearing.  Pulmonary:     Effort: Pulmonary effort is normal. No respiratory distress.  Genitourinary:  Comments: Deferred-self swab performed by patient Skin:    General: Skin is warm and dry.     Capillary Refill: Capillary refill takes less than 2 seconds.     Coloration: Skin is not jaundiced or pale.  Neurological:     Mental Status: He is alert and oriented to person, place, and time.     Motor: No weakness.     Gait: Gait normal.  Psychiatric:        Behavior: Behavior is cooperative.      UC Treatments / Results  Labs (all labs ordered are listed, but only abnormal results are displayed) Labs Reviewed  CYTOLOGY, (ORAL, ANAL, URETHRAL) ANCILLARY ONLY    EKG   Radiology No results found.  Procedures Procedures (including critical care time)  Medications Ordered in UC Medications - No data to display  Initial Impression / Assessment and Plan / UC Course  I have reviewed the triage vital signs and the nursing notes.  Pertinent labs & imaging results that were available during my care of the patient were reviewed by me and considered in my medical decision making (see chart for details).   Patient is well-appearing, normotensive, afebrile, not  tachycardic, not tachypneic, oxygenating well on room air.    1. Screening for venereal disease Cytology is pending for STI Patient declines HIV and syphilis testing, was initially agreeable to change his mind Recommended condom use with every sexual encounter and condoms given today  The patient was given the opportunity to ask questions.  All questions answered to their satisfaction.  The patient is in agreement to this plan.    Final Clinical Impressions(s) / UC Diagnoses   Final diagnoses:  Screening for venereal disease     Discharge Instructions      We are screening you today for gonorrhea, chlamydia, trichomonas, HIV, and syphilis.  Will contact you anything is positive.  Recommend condom use with every sexual encounter to prevent STI.    ED Prescriptions   None    PDMP not reviewed this encounter.   Valentino Nose, NP 07/04/23 1158

## 2023-07-06 ENCOUNTER — Ambulatory Visit: Payer: Self-pay | Admitting: *Deleted

## 2023-07-06 LAB — CYTOLOGY, (ORAL, ANAL, URETHRAL) ANCILLARY ONLY
Chlamydia: NEGATIVE
Comment: NEGATIVE
Comment: NEGATIVE
Comment: NORMAL
Neisseria Gonorrhea: NEGATIVE
Trichomonas: NEGATIVE

## 2023-07-06 NOTE — Telephone Encounter (Signed)
Reason for Disposition  Health Information question, no triage required and triager able to answer question    Lab results given  Answer Assessment - Initial Assessment Questions 1. REASON FOR CALL or QUESTION: "What is your reason for calling today?" or "How can I best help you?" or "What question do you have that I can help answer?"     Pt called into [facility] for his lab results.   I let him know all the test results for STDs were negative according to his chart. No questions from pt.  Protocols used: Information Only Call - No Triage-A-AH

## 2023-07-06 NOTE — Telephone Encounter (Signed)
  Chief Complaint: Called in for his lab results.   I let him know according to his chart all the tests for STDs were negative and I listed them off to him.    No questions from pt. Symptoms: N/A Frequency: N/A Pertinent Negatives: Patient denies N/A Disposition: [] ED /[] Urgent Care (no appt availability in office) / [] Appointment(In office/virtual)/ []  [facility]  Virtual Care/ [x] Home Care/ [] Refused Recommended Disposition /[]  Mobile Bus/ []  Follow-up with PCP Additional Notes:

## 2024-01-21 ENCOUNTER — Encounter (HOSPITAL_BASED_OUTPATIENT_CLINIC_OR_DEPARTMENT_OTHER): Payer: Self-pay

## 2024-01-21 ENCOUNTER — Emergency Department (HOSPITAL_BASED_OUTPATIENT_CLINIC_OR_DEPARTMENT_OTHER)
Admission: EM | Admit: 2024-01-21 | Discharge: 2024-01-21 | Disposition: A | Discharge status: Home / Self Care | Attending: Emergency Medicine | Admitting: Emergency Medicine

## 2024-01-21 ENCOUNTER — Other Ambulatory Visit: Payer: Self-pay

## 2024-01-21 DIAGNOSIS — J45909 Unspecified asthma, uncomplicated: Secondary | ICD-10-CM | POA: Insufficient documentation

## 2024-01-21 DIAGNOSIS — F17210 Nicotine dependence, cigarettes, uncomplicated: Secondary | ICD-10-CM | POA: Diagnosis not present

## 2024-01-21 DIAGNOSIS — J029 Acute pharyngitis, unspecified: Secondary | ICD-10-CM | POA: Insufficient documentation

## 2024-01-21 DIAGNOSIS — H9201 Otalgia, right ear: Secondary | ICD-10-CM | POA: Diagnosis not present

## 2024-01-21 LAB — RESP PANEL BY RT-PCR (RSV, FLU A&B, COVID)  RVPGX2
Influenza A by PCR: NEGATIVE
Influenza B by PCR: NEGATIVE
Resp Syncytial Virus by PCR: NEGATIVE
SARS Coronavirus 2 by RT PCR: NEGATIVE

## 2024-01-21 LAB — GROUP A STREP BY PCR: Group A Strep by PCR: NOT DETECTED

## 2024-01-21 MED ORDER — LIDOCAINE VISCOUS HCL 2 % MT SOLN: 15.0000 mL | OROMUCOSAL | 0 refills | Status: DC | PRN

## 2024-01-21 MED ORDER — AMOXICILLIN-POT CLAVULANATE 875-125 MG PO TABS: 1.0000 | ORAL_TABLET | Freq: Two times a day (BID) | ORAL | 0 refills | Status: DC

## 2024-01-21 MED ORDER — AMOXICILLIN-POT CLAVULANATE 875-125 MG PO TABS
1.0000 | ORAL_TABLET | Freq: Once | ORAL | Status: AC
  Administered 2024-01-21: 1 via ORAL
  Filled 2024-01-21: qty 1

## 2024-01-21 MED ORDER — DEXAMETHASONE 10 MG/ML FOR PEDIATRIC ORAL USE
10.0000 mg | Freq: Once | INTRAMUSCULAR | Status: AC
  Administered 2024-01-21: 10 mg via ORAL
  Filled 2024-01-21: qty 1

## 2024-01-21 NOTE — ED Triage Notes (Signed)
 Pt presents via POV c/o sore throat and right ear pain x1 week.

## 2024-01-21 NOTE — ED Provider Notes (Signed)
 [place] EMERGENCY DEPARTMENT AT [facility] Provider Note  CSN: [id number] Arrival date & time: 01/21/24 1914  Chief Complaint(s) Sore Throat and Otalgia  HPI Kyle Kelly is a 28 y.o. male with past medical history as below, significant for asthma who presents to the ED with complaint of sore throat, earache  Patient is having sore throat for approximately 1 week.  Right sided.  Also has some pain to the right side of his face, right ear.  No pain when chewing.  No pain to his teeth.  He has some pain with swallowing.  No difficulty breathing.  No fevers or chills, no nausea or vomiting.  He is tolerant p.o. intake.  Tolerating his own secretions.  No sick contacts.  Denies history of recurrent pharyngitis.  Past Medical History Past Medical History:  Diagnosis Date   Asthma    not recent   Patient Active Problem List   Diagnosis Date Noted   Acute bronchitis 05/28/2017   Respiratory failure (HCC) 05/27/2017   Home Medication(s) Prior to Admission medications   Medication Sig Start Date End Date Taking? Authorizing Provider  amoxicillin-clavulanate (AUGMENTIN) 875-125 MG tablet Take 1 tablet by mouth every 12 (twelve) hours. 01/21/24  Yes Tanda Rockers A, DO  lidocaine (XYLOCAINE) 2 % solution Use as directed 15 mLs in the mouth or throat as needed for mouth pain. 01/21/24  Yes Tanda Rockers A, DO  lisdexamfetamine (VYVANSE) 60 MG capsule Take by mouth.    [provider]  meloxicam (MOBIC) 15 MG tablet Take 1 tablet (15 mg total) by mouth daily. Do not combine this medication with ibuprofen, Advil, or Aleve as they are similar medications 04/08/21   Particia Nearing, PA-C                                                                                                                                    Past Surgical History History reviewed. No pertinent surgical history. Family History Family History  Problem Relation Age of Onset   Asthma Mother     Healthy Father     Social History Social History   Tobacco Use   Smoking status: Every Day    Types: Cigarettes   Smokeless tobacco: Never  Vaping Use   Vaping status: Never Used  Substance Use Topics   Alcohol use: Not Currently   Drug use: Never   Allergies Sulfonylureas, Elemental sulfur, and Sulfa antibiotics  Review of Systems A thorough review of systems was obtained and all systems are negative except as noted in the HPI and PMH.   Physical Exam Vital Signs  I have reviewed the triage vital signs BP 136/89   Pulse 62   Temp 98.3 F (36.8 C) (Temporal)   Resp 16   SpO2 100%  Physical Exam Vitals and nursing note reviewed.  Constitutional:      General: He is not in acute distress.  Appearance: Normal appearance. He is well-developed. He is not ill-appearing.  HENT:     Head: Normocephalic and atraumatic. No raccoon eyes, Battle's sign, right periorbital erythema or left periorbital erythema.     Jaw: There is normal jaw occlusion.     Comments: Minimal tonsillar exudate on the right    Right Ear: Tympanic membrane and external ear normal.     Left Ear: Tympanic membrane and external ear normal.     Nose: Nose normal.     Mouth/Throat:     Mouth: Mucous membranes are moist. No angioedema.     Dentition: No dental caries.     Pharynx: Uvula midline. Oropharyngeal exudate and posterior oropharyngeal erythema present.     Comments: No obvious tonsillar abscess.  No drooling stridor or trismus Eyes:     General: No scleral icterus.       Right eye: No discharge.        Left eye: No discharge.  Cardiovascular:     Rate and Rhythm: Normal rate.  Pulmonary:     Effort: Pulmonary effort is normal. No respiratory distress.     Breath sounds: No stridor.  Abdominal:     General: Abdomen is flat. There is no distension.     Tenderness: There is no guarding.  Musculoskeletal:        General: No deformity.     Cervical back: No rigidity.  Skin:    General:  Skin is warm and dry.     Coloration: Skin is not cyanotic, jaundiced or pale.  Neurological:     Mental Status: He is alert and oriented to person, place, and time.     GCS: GCS eye subscore is 4. GCS verbal subscore is 5. GCS motor subscore is 6.  Psychiatric:        Speech: Speech normal.        Behavior: Behavior normal. Behavior is cooperative.     ED Results and Treatments Labs (all labs ordered are listed, but only abnormal results are displayed) Labs Reviewed  RESP PANEL BY RT-PCR (RSV, FLU A&B, COVID)  RVPGX2  GROUP A STREP BY PCR  GROUP A STREP BY PCR                                                                                                                          Radiology No results found.  Pertinent labs & imaging results that were available during my care of the patient were reviewed by me and considered in my medical decision making (see MDM for details).  Medications Ordered in ED Medications  amoxicillin-clavulanate (AUGMENTIN) 875-125 MG per tablet 1 tablet (has no administration in time range)  dexamethasone (DECADRON) 10 MG/ML injection for Pediatric ORAL use 10 mg (has no administration in time range)  Procedures Procedures  (including critical care time)  Medical Decision Making / ED Course    Medical Decision Making:    Kyle Kelly is a 28 y.o. male with past medical history as below, significant for asthma who presents to the ED with complaint of sore throat, earache. The complaint involves an extensive differential diagnosis and also carries with it a high risk of complications and morbidity.  Serious etiology was considered. Ddx includes but is not limited to: Pharyngitis, tonsillitis, PTA, viral syndrome, etc.  Complete initial physical exam performed, notably the patient was in no distress, sitting  comfortably.    Reviewed and confirmed nursing documentation for past medical history, family history, social history.  Vital signs reviewed.     Clinical Course as of 01/21/24 4098  Sun Jan 21, 2024  0506 Swabs neg [SG]    Clinical Course User Index [SG] Sloan Leiter, DO    Brief summary: 28 year old male with history as above here with sore throat x 1 week. He has erythematous posterior oropharynx, some minimal tonsillar exudate on the right.  Does not appear to have an obvious peritonsillar abscess.  Uvula is midline.  No drooling stridor or trismus.  He is tolerating secretions without difficulty.  Breathing comfortably.  Viral swab and strep swab was negative.  Given duration of symptoms we will go ahead and start him on Augmentin, give Decadron here.  Continue antibiotics at home, supportive care at home, strict return precautions emphasized.  Stable for discharge  The patient improved significantly and was discharged in stable condition. Detailed discussions were had with the patient/guardian regarding current findings, and need for close f/u with PCP or on call doctor. The patient/guardian has been instructed to return immediately if the symptoms worsen in any way for re-evaluation. Patient/guardian verbalized understanding and is in agreement with current care plan. All questions answered prior to discharge.                  Additional history obtained: -Additional history obtained from spouse -External records from outside source obtained and reviewed including: Chart review including previous notes, labs, imaging, consultation notes including  Prior urgent care evaluation, prior ED evaluation, prior labs   Lab Tests: -I ordered, reviewed, and interpreted labs.   The pertinent results include:   Labs Reviewed  RESP PANEL BY RT-PCR (RSV, FLU A&B, COVID)  RVPGX2  GROUP A STREP BY PCR  GROUP A STREP BY PCR    Notable for swabs were negative  EKG   EKG  Interpretation Date/Time:    Ventricular Rate:    PR Interval:    QRS Duration:    QT Interval:    QTC Calculation:   R Axis:      Text Interpretation:           Imaging Studies ordered: na   Medicines ordered and prescription drug management: Meds ordered this encounter  Medications   amoxicillin-clavulanate (AUGMENTIN) 875-125 MG per tablet 1 tablet   dexamethasone (DECADRON) 10 MG/ML injection for Pediatric ORAL use 10 mg   amoxicillin-clavulanate (AUGMENTIN) 875-125 MG tablet    Sig: Take 1 tablet by mouth every 12 (twelve) hours.    Dispense:  14 tablet    Refill:  0   lidocaine (XYLOCAINE) 2 % solution    Sig: Use as directed 15 mLs in the mouth or throat as needed for mouth pain.    Dispense:  100 mL    Refill:  0    -I  have reviewed the patients home medicines and have made adjustments as needed   Consultations Obtained: na   Cardiac Monitoring: Continuous pulse oximetry interpreted by myself, 100% on RA.    Social Determinants of Health:  Diagnosis or treatment significantly limited by social determinants of health: no pcp, tobacco use   Reevaluation: After the interventions noted above, I reevaluated the patient and found that they have improved  Co morbidities that complicate the patient evaluation  Past Medical History:  Diagnosis Date   Asthma    not recent      Dispostion: Disposition decision including need for hospitalization was considered, and patient discharged from emergency department.    Final Clinical Impression(s) / ED Diagnoses Final diagnoses:  Pharyngitis, unspecified etiology        Sloan Leiter, DO 01/21/24 [phone]

## 2024-01-21 NOTE — Discharge Instructions (Signed)
 It was a pleasure caring for you today in the emergency department.  Be sure to drink plenty of liquids over the next few days.  Get plenty of rest.  You can alternate acetaminophen and ibuprofen that is available over-the-counter to help with discomfort. Dose per instructions on packaging.  Please return to the emergency department for any worsening or worrisome symptoms, including but not limited to: Difficulty breathing, vomiting, fever, worsening discomfort to your throat, drooling, etc.

## 2024-02-06 ENCOUNTER — Ambulatory Visit
Admission: EM | Admit: 2024-02-06 | Discharge: 2024-02-06 | Disposition: A | Discharge status: Home / Self Care | Attending: Family Medicine | Admitting: Family Medicine

## 2024-02-06 DIAGNOSIS — Z202 Contact with and (suspected) exposure to infections with a predominantly sexual mode of transmission: Secondary | ICD-10-CM | POA: Diagnosis not present

## 2024-02-06 MED ORDER — METRONIDAZOLE 500 MG PO TABS: 2000.0000 mg | ORAL_TABLET | Freq: Once | ORAL | 0 refills | Status: AC

## 2024-02-06 NOTE — ED Triage Notes (Signed)
"  I have been exposed to Trichomonas and need treatment". No current symptoms.

## 2024-02-06 NOTE — ED Provider Notes (Signed)
 [name]    CSN: [id number] Arrival date & time: 02/06/24  0818      History   Chief Complaint Chief Complaint  Patient presents with   SEXUALLY TRANSMITTED DISEASE    Testing    HPI Kyle Kelly is a 28 y.o. male.   HPI Here for exposure to Trichomonas, a partner told him she tested positive. No dc or itching or dysuria.  Allergic to sulfa   Past Medical History:  Diagnosis Date   Asthma    not recent    Patient Active Problem List   Diagnosis Date Noted   Acute bronchitis 05/28/2017   Respiratory failure (HCC) 05/27/2017   Elevated blood pressure reading 12/03/2013    History reviewed. No pertinent surgical history.     Home Medications    Prior to Admission medications   Medication Sig Start Date End Date Taking? Authorizing Provider  metroNIDAZOLE (FLAGYL) 500 MG tablet Take 4 tablets (2,000 mg total) by mouth once for 1 dose. 02/06/24 02/06/24 Yes Melyssa Signor, Janace Aris, MD  cloNIDine (CATAPRES) 0.2 MG tablet Take 0.2 mg by mouth at bedtime.    [provider]  lidocaine (XYLOCAINE) 2 % solution Use as directed 15 mLs in the mouth or throat as needed for mouth pain. 01/21/24   Sloan Leiter, DO  lisdexamfetamine (VYVANSE) 60 MG capsule Take by mouth.    [provider]    Family History Family History  Problem Relation Age of Onset   Asthma Mother    Healthy Father     Social History Social History   Tobacco Use   Smoking status: Every Day    Types: Cigarettes   Smokeless tobacco: Never  Vaping Use   Vaping status: Never Used  Substance Use Topics   Alcohol use: Yes    Comment: Occassionally.   Drug use: Yes    Types: Marijuana    Comment: 1x weekly.     Allergies   Sulfonylureas, Elemental sulfur, and Sulfa antibiotics   Review of Systems Review of Systems   Physical Exam Triage Vital Signs ED Triage Vitals  Encounter Vitals Group     BP 02/06/24 0941 101/68     Systolic BP Percentile --       Diastolic BP Percentile --      Pulse Rate 02/06/24 0941 64     Resp 02/06/24 0941 18     Temp 02/06/24 0941 97.8 F (36.6 C)     Temp Source 02/06/24 0941 Oral     SpO2 02/06/24 0941 96 %     Weight 02/06/24 0939 224 lb (101.6 kg)     Height 02/06/24 0939 6\' 2"  (1.88 m)     Head Circumference --      Peak Flow --      Pain Score 02/06/24 0938 0     Pain Loc --      Pain Education --      Exclude from Growth Chart --    No data found.  Updated Vital Signs BP 101/68 (BP Location: Left Arm)   Pulse 64   Temp 97.8 F (36.6 C) (Oral)   Resp 18   Ht 6\' 2"  (1.88 m)   Wt 101.6 kg   SpO2 96%   BMI 28.76 kg/m   Visual Acuity Right Eye Distance:   Left Eye Distance:   Bilateral Distance:    Right Eye Near:   Left Eye Near:    Bilateral Near:  Physical Exam Vitals reviewed.  Constitutional:      General: He is not in acute distress.    Appearance: He is not ill-appearing, toxic-appearing or diaphoretic.  Skin:    Coloration: Skin is not pale.  Neurological:     General: No focal deficit present.     Mental Status: He is alert and oriented to person, place, and time.  Psychiatric:        Behavior: Behavior normal.      UC Treatments / Results  Labs (all labs ordered are listed, but only abnormal results are displayed) Labs Reviewed  HIV ANTIBODY (ROUTINE TESTING W REFLEX)  RPR  CYTOLOGY, (ORAL, ANAL, URETHRAL) ANCILLARY ONLY    EKG   Radiology No results found.  Procedures Procedures (including critical care time)  Medications Ordered in UC Medications - No data to display  Initial Impression / Assessment and Plan / UC Course  I have reviewed the triage vital signs and the nursing notes.  Pertinent labs & imaging results that were available during my care of the patient were reviewed by me and considered in my medical decision making (see chart for details).     Blood is drawn to check HIV and RPR.  Urethral self swab is done and staff will  notify him of any positives and treat per protocol. Flagyl is sent to the pharmacy to treat empirically for Trich Final Clinical Impressions(s) / UC Diagnoses   Final diagnoses:  Exposure to STD     Discharge Instructions      Take metronidazole 500 mg--4 tabs taken by mouth at one time, best after a decent sized meal.  Avoid drinking alcohol within 72 hours of taking this medication  Staff will notify you if there is anything positive on the swab.       ED Prescriptions     Medication Sig Dispense Auth. Provider   metroNIDAZOLE (FLAGYL) 500 MG tablet Take 4 tablets (2,000 mg total) by mouth once for 1 dose. 4 tablet Kairo Laubacher, Janace Aris, MD      PDMP not reviewed this encounter.   Zenia Resides, MD 02/06/24 1040

## 2024-02-06 NOTE — Discharge Instructions (Signed)
 Take metronidazole 500 mg--4 tabs taken by mouth at one time, best after a decent sized meal.  Avoid drinking alcohol within 72 hours of taking this medication  Staff will notify you if there is anything positive on the swab.

## 2024-02-07 LAB — CYTOLOGY, (ORAL, ANAL, URETHRAL) ANCILLARY ONLY
Chlamydia: NEGATIVE
Comment: NEGATIVE
Comment: NEGATIVE
Comment: NORMAL
Neisseria Gonorrhea: NEGATIVE
Trichomonas: NEGATIVE

## 2024-02-07 LAB — HIV ANTIBODY (ROUTINE TESTING W REFLEX): HIV Screen 4th Generation wRfx: NONREACTIVE

## 2024-02-07 LAB — RPR: RPR Ser Ql: NONREACTIVE

## 2024-03-18 ENCOUNTER — Ambulatory Visit
Admission: RE | Admit: 2024-03-18 | Discharge: 2024-03-18 | Disposition: A | Discharge status: Home / Self Care | Source: Ambulatory Visit | Attending: Internal Medicine | Admitting: Internal Medicine

## 2024-03-18 VITALS — BP 127/80 | HR 68 | Temp 97.7°F | Resp 16

## 2024-03-18 DIAGNOSIS — Z9189 Other specified personal risk factors, not elsewhere classified: Secondary | ICD-10-CM | POA: Insufficient documentation

## 2024-03-18 NOTE — ED Triage Notes (Addendum)
 Pt presents for STD testing. Denies symptoms or known exposure. Would like blood work

## 2024-03-18 NOTE — ED Provider Notes (Addendum)
 [facility]    CSN: [id number] Arrival date & time: 03/18/24  1034      History   Chief Complaint Chief Complaint  Patient presents with   Exposure to STD    I just want to come and get checked - Entered by patient    HPI Kyle Kelly is a 28 y.o. male.   Kyle Kelly is a 28 y.o. male presenting to urgent care requesting STI testing.  Currently asymptomatic and without recent known STI exposure.  Sexually active with  male partner(s) without protection. Denies urinary symptoms, N/V/D, pelvic/abdominal pain, new low back pain, fever/chills.  No penile discharge, odor, or itch.  He is without other complaint and is asymptomatic.     Exposure to STD    Past Medical History:  Diagnosis Date   Asthma    not recent    Patient Active Problem List   Diagnosis Date Noted   Acute bronchitis 05/28/2017   Respiratory failure (HCC) 05/27/2017   Elevated blood pressure reading 12/03/2013    History reviewed. No pertinent surgical history.     Home Medications    Prior to Admission medications   Medication Sig Start Date End Date Taking? Authorizing Provider  cloNIDine (CATAPRES) 0.2 MG tablet Take 0.2 mg by mouth at bedtime.    [provider]  lidocaine  (XYLOCAINE ) 2 % solution Use as directed 15 mLs in the mouth or throat as needed for mouth pain. 01/21/24   Teddi Favors, DO  lisdexamfetamine (VYVANSE) 60 MG capsule Take by mouth.    [provider]    Family History Family History  Problem Relation Age of Onset   Asthma Mother    Healthy Father     Social History Social History   Tobacco Use   Smoking status: Every Day    Types: Cigarettes   Smokeless tobacco: Never  Vaping Use   Vaping status: Never Used  Substance Use Topics   Alcohol use: Yes    Comment: Occassionally.   Drug use: Yes    Types: Marijuana    Comment: 1x weekly.     Allergies   Sulfonylureas, Elemental sulfur, and Sulfa  antibiotics   Review of Systems Review of Systems Per HPI  Physical Exam Triage Vital Signs ED Triage Vitals  Encounter Vitals Group     BP 03/18/24 1037 127/80     Systolic BP Percentile --      Diastolic BP Percentile --      Pulse Rate 03/18/24 1037 68     Resp 03/18/24 1037 16     Temp 03/18/24 1037 97.7 F (36.5 C)     Temp Source 03/18/24 1037 Oral     SpO2 03/18/24 1037 96 %     Weight --      Height --      Head Circumference --      Peak Flow --      Pain Score 03/18/24 1042 0     Pain Loc --      Pain Education --      Exclude from Growth Chart --    No data found.  Updated Vital Signs BP 127/80 (BP Location: Right Arm)   Pulse 68   Temp 97.7 F (36.5 C) (Oral)   Resp 16   SpO2 96%   Visual Acuity Right Eye Distance:   Left Eye Distance:   Bilateral Distance:    Right Eye Near:   Left Eye Near:  Bilateral Near:     Physical Exam Vitals and nursing note reviewed.  Constitutional:      Appearance: He is not ill-appearing or toxic-appearing.  HENT:     Head: Normocephalic and atraumatic.     Right Ear: Hearing and external ear normal.     Left Ear: Hearing and external ear normal.     Nose: Nose normal.     Mouth/Throat:     Lips: Pink.  Eyes:     General: Lids are normal. Vision grossly intact. Gaze aligned appropriately.     Extraocular Movements: Extraocular movements intact.     Conjunctiva/sclera: Conjunctivae normal.  Pulmonary:     Effort: Pulmonary effort is normal.  Musculoskeletal:     Cervical back: Neck supple.  Skin:    General: Skin is warm and dry.     Capillary Refill: Capillary refill takes less than 2 seconds.     Findings: No rash.  Neurological:     General: No focal deficit present.     Mental Status: He is alert and oriented to person, place, and time. Mental status is at baseline.     Cranial Nerves: No dysarthria or facial asymmetry.  Psychiatric:        Mood and Affect: Mood normal.        Speech: Speech  normal.        Behavior: Behavior normal.        Thought Content: Thought content normal.        Judgment: Judgment normal.      UC Treatments / Results  Labs (all labs ordered are listed, but only abnormal results are displayed) Labs Reviewed  CYTOLOGY, (ORAL, ANAL, URETHRAL) ANCILLARY ONLY    EKG   Radiology No results found.  Procedures Procedures (including critical care time)  Medications Ordered in UC Medications - No data to display  Initial Impression / Assessment and Plan / UC Course  I have reviewed the triage vital signs and the nursing notes.  Pertinent labs & imaging results that were available during my care of the patient were reviewed by me and considered in my medical decision making (see chart for details).   1. At risk for STD due to unprotected sex STI labs pending, will notify patient of positive results and treat accordingly per protocol when labs result.  Patient recently had HIV and syphilis testing last month and declines this today (both were negative). Patient to avoid sexual intercourse until screening testing comes back.   Education provided regarding safe sexual practices and patient encouraged to use protection to prevent spread of STIs.    Counseled patient on potential for adverse effects with medications prescribed/recommended today, strict ER and return-to-clinic precautions discussed, patient verbalized understanding.    Final Clinical Impressions(s) / UC Diagnoses   Final diagnoses:  At risk for sexually transmitted disease due to unprotected sex     Discharge Instructions      STD testing pending, this will take 2-3 days to result. We will only call you if your testing is positive for any infection(s) and we will provide treatment.  Avoid sexual intercourse until your STD results come back.  If any of your STD results are positive, you will need to avoid sexual intercourse for 7 days while you are being treated to prevent  spread of STD.  Condom use is the best way to prevent spread of STDs. Notify partner(s) of any positive results.  Return to urgent care as needed.  ED Prescriptions   None    PDMP not reviewed this encounter.   Starlene Eaton, FNP 03/18/24 1051    Shella Devoid [place], [place] 03/18/24 1051

## 2024-03-18 NOTE — Discharge Instructions (Signed)

## 2024-03-19 LAB — CYTOLOGY, (ORAL, ANAL, URETHRAL) ANCILLARY ONLY
Chlamydia: NEGATIVE
Comment: NEGATIVE
Comment: NEGATIVE
Comment: NORMAL
Neisseria Gonorrhea: NEGATIVE
Trichomonas: NEGATIVE

## 2024-04-06 ENCOUNTER — Encounter (HOSPITAL_BASED_OUTPATIENT_CLINIC_OR_DEPARTMENT_OTHER): Payer: Self-pay

## 2024-04-06 ENCOUNTER — Emergency Department (HOSPITAL_BASED_OUTPATIENT_CLINIC_OR_DEPARTMENT_OTHER)
Admission: EM | Admit: 2024-04-06 | Discharge: 2024-04-06 | Disposition: A | Discharge status: Home / Self Care | Attending: Emergency Medicine | Admitting: Emergency Medicine

## 2024-04-06 ENCOUNTER — Other Ambulatory Visit: Payer: Self-pay

## 2024-04-06 DIAGNOSIS — K0889 Other specified disorders of teeth and supporting structures: Secondary | ICD-10-CM | POA: Diagnosis present

## 2024-04-06 MED ORDER — CLINDAMYCIN HCL 300 MG PO CAPS: 300.0000 mg | ORAL_CAPSULE | Freq: Four times a day (QID) | ORAL | 0 refills | Status: DC

## 2024-04-06 MED ORDER — HYDROCODONE-ACETAMINOPHEN 5-325 MG PO TABS: 1.0000 | ORAL_TABLET | Freq: Four times a day (QID) | ORAL | 0 refills | Status: AC | PRN

## 2024-04-06 MED ORDER — CLINDAMYCIN HCL 150 MG PO CAPS
300.0000 mg | ORAL_CAPSULE | Freq: Once | ORAL | Status: AC
  Administered 2024-04-06: 300 mg via ORAL
  Filled 2024-04-06: qty 2

## 2024-04-06 MED ORDER — HYDROCODONE-ACETAMINOPHEN 5-325 MG PO TABS
2.0000 | ORAL_TABLET | Freq: Once | ORAL | Status: AC
  Administered 2024-04-06: 2 via ORAL
  Filled 2024-04-06: qty 2

## 2024-04-06 NOTE — ED Provider Notes (Signed)
  [place] EMERGENCY DEPARTMENT AT [facility] Provider Note   CSN: [id number] Arrival date & time: 04/06/24  0044     History  Chief Complaint  Patient presents with   Dental Pain    Kyle Kelly is a 29 y.o. male.  Patient is a 28 year old male presenting with complaints of dental pain.  Earlier this week he had an extraction of several right upper molars.  He is describing ongoing discomfort and is concerned about the possibility of infection.  He was prescribed hydrocodone  which she has since run out of.  No fevers or chills.  No difficulty breathing or swallowing.       Home Medications Prior to Admission medications   Medication Sig Start Date End Date Taking? Authorizing Provider  cloNIDine (CATAPRES) 0.2 MG tablet Take 0.2 mg by mouth at bedtime.    [provider]  lidocaine  (XYLOCAINE ) 2 % solution Use as directed 15 mLs in the mouth or throat as needed for mouth pain. 01/21/24   Teddi Favors, DO  lisdexamfetamine (VYVANSE) 60 MG capsule Take by mouth.    [provider]      Allergies    Sulfonylureas, Elemental sulfur, and Sulfa antibiotics    Review of Systems   Review of Systems  All other systems reviewed and are negative.   Physical Exam Updated Vital Signs BP (!) 161/89   Pulse 70   Temp 98.3 F (36.8 C) (Oral)   Resp 16   Ht 6\' 2"  (1.88 m)   Wt 83.9 kg   SpO2 100%   BMI 23.75 kg/m  Physical Exam Vitals and nursing note reviewed.  Constitutional:      Appearance: Normal appearance.  HENT:     Head: Normocephalic.     Mouth/Throat:     Comments: There are missing right upper molars with a green-colored stitch in place.  There is some scabbing noted, but no purulent drainage.  There is some gingival inflammation, but no abscess noted. Pulmonary:     Effort: Pulmonary effort is normal.  Skin:    General: Skin is warm and dry.  Neurological:     Mental Status: He is alert and oriented to person, place, and  time.     ED Results / Procedures / Treatments   Labs (all labs ordered are listed, but only abnormal results are displayed) Labs Reviewed - No data to display  EKG None  Radiology No results found.  Procedures Procedures    Medications Ordered in ED Medications  HYDROcodone -acetaminophen  (NORCO/VICODIN) 5-325 MG per tablet 2 tablet (has no administration in time range)  clindamycin (CLEOCIN) capsule 300 mg (has no administration in time range)    ED Course/ Medical Decision Making/ A&P  Patient with ongoing dental pain status post extraction of several molars.  The extraction site appears well, but will prescribe antibiotics and pain medication.  He is to follow-up with his dentist next week if symptoms persist.  Final Clinical Impression(s) / ED Diagnoses Final diagnoses:  None    Rx / DC Orders ED Discharge Orders     None         Orvilla Blander, MD 04/06/24 0105

## 2024-04-06 NOTE — ED Triage Notes (Signed)
 Pt presents via POV c/o dental pain. Reports had dental procedure on Tuesday and required stitches. C/o continued pain and possible infection.

## 2024-04-06 NOTE — Discharge Instructions (Signed)
 Begin taking clindamycin as prescribed.  Take ibuprofen  600 mg every 6 hours as needed for pain.  Begin taking hydrocodone  as prescribed as needed for pain not relieved with ibuprofen .  Follow-up with your dentist next week if symptoms persist.

## 2024-04-07 ENCOUNTER — Other Ambulatory Visit: Payer: Self-pay

## 2024-04-07 ENCOUNTER — Encounter (HOSPITAL_BASED_OUTPATIENT_CLINIC_OR_DEPARTMENT_OTHER): Payer: Self-pay | Admitting: Emergency Medicine

## 2024-04-07 DIAGNOSIS — J45909 Unspecified asthma, uncomplicated: Secondary | ICD-10-CM | POA: Diagnosis not present

## 2024-04-07 DIAGNOSIS — K0889 Other specified disorders of teeth and supporting structures: Secondary | ICD-10-CM | POA: Diagnosis present

## 2024-04-07 NOTE — ED Triage Notes (Signed)
 Tooth extraction on Tuesday. Comes in for help with pain management. Taking Percocet at home- at times doubling dose with no relief. Refuses dose here in triage. Afebrile. Stitches appear to be intact and no bleeding seen.

## 2024-04-08 ENCOUNTER — Emergency Department (HOSPITAL_BASED_OUTPATIENT_CLINIC_OR_DEPARTMENT_OTHER)
Admission: EM | Admit: 2024-04-08 | Discharge: 2024-04-08 | Disposition: A | Discharge status: Home / Self Care | Attending: Emergency Medicine | Admitting: Emergency Medicine

## 2024-04-08 DIAGNOSIS — K0889 Other specified disorders of teeth and supporting structures: Secondary | ICD-10-CM

## 2024-04-08 MED ORDER — CHLORHEXIDINE GLUCONATE 0.12 % MT SOLN: 15.0000 mL | Freq: Two times a day (BID) | OROMUCOSAL | 0 refills | Status: AC

## 2024-04-08 MED ORDER — LIDOCAINE VISCOUS HCL 2 % MT SOLN: 5.0000 mL | Freq: Four times a day (QID) | OROMUCOSAL | 0 refills | Status: AC | PRN

## 2024-04-08 MED ORDER — LIDOCAINE VISCOUS HCL 2 % MT SOLN
15.0000 mL | Freq: Once | OROMUCOSAL | Status: AC
  Administered 2024-04-08: 15 mL via OROMUCOSAL
  Filled 2024-04-08: qty 15

## 2024-04-08 MED ORDER — CHLORHEXIDINE GLUCONATE 0.12 % MT SOLN
10.0000 mL | Freq: Once | OROMUCOSAL | Status: DC
  Filled 2024-04-08: qty 15

## 2024-04-08 NOTE — ED Provider Notes (Signed)
 [place] EMERGENCY DEPARTMENT AT [facility] Provider Note  CSN: [id number] Arrival date & time: 04/07/24 2336  Chief Complaint(s) Dental Pain  HPI Kyle Kelly is a 28 y.o. male here with persistent right upper gingival pain following dental extraction.  He was seen 2 days ago here in the emergency department and prescribed clindamycin.  He was also prescribed Vicodin.  He reports that this medicine is not helping with his pain.  HPI  Past Medical History Past Medical History:  Diagnosis Date   Asthma    not recent   Patient Active Problem List   Diagnosis Date Noted   Acute bronchitis 05/28/2017   Respiratory failure (HCC) 05/27/2017   Elevated blood pressure reading 12/03/2013   Home Medication(s) Prior to Admission medications   Medication Sig Start Date End Date Taking? Authorizing Provider  chlorhexidine  (PERIDEX ) 0.12 % solution Use as directed 15 mLs in the mouth or throat 2 (two) times daily. 04/08/24  Yes Hernandez Losasso, Camila Cecil, MD  lidocaine  (XYLOCAINE ) 2 % solution Use as directed 5-10 mLs in the mouth or throat every 6 (six) hours as needed (dental pain). 04/08/24  Yes Danyetta Gillham, Camila Cecil, MD  clindamycin (CLEOCIN) 300 MG capsule Take 1 capsule (300 mg total) by mouth 4 (four) times daily. X 7 days 04/06/24   Orvilla Blander, MD  cloNIDine (CATAPRES) 0.2 MG tablet Take 0.2 mg by mouth at bedtime.    [provider]  HYDROcodone -acetaminophen  (NORCO/VICODIN) 5-325 MG tablet Take 1-2 tablets by mouth every 6 (six) hours as needed. 04/06/24   Orvilla Blander, MD  lisdexamfetamine (VYVANSE) 60 MG capsule Take by mouth.    [provider]                                                                                                                                    Allergies Sulfonylureas, Elemental sulfur, and Sulfa antibiotics  Review of Systems Review of Systems As noted in HPI  Physical Exam Vital Signs  I have reviewed the triage vital  signs BP (!) 145/74 (BP Location: Left Arm)   Pulse 85   Temp 98 F (36.7 C) (Oral)   Resp 16   SpO2 100%   Physical Exam Vitals reviewed.  Constitutional:      General: He is not in acute distress.    Appearance: He is well-developed. He is not diaphoretic.  HENT:     Head: Normocephalic and atraumatic.     Right Ear: External ear normal.     Left Ear: External ear normal.     Nose: Nose normal.     Mouth/Throat:     Mouth: Mucous membranes are moist.     Comments: Status post extraction with mild gingival erythema and ulceration.  No obvious purulence or abscess appreciated. Eyes:     General: No scleral icterus.    Conjunctiva/sclera: Conjunctivae normal.  Neck:     Trachea: Phonation  normal.  Cardiovascular:     Rate and Rhythm: Normal rate and regular rhythm.  Pulmonary:     Effort: Pulmonary effort is normal. No respiratory distress.     Breath sounds: No stridor.  Abdominal:     General: There is no distension.  Musculoskeletal:        General: Normal range of motion.     Cervical back: Normal range of motion.  Neurological:     Mental Status: He is alert and oriented to person, place, and time.  Psychiatric:        Behavior: Behavior normal.     ED Results and Treatments Labs (all labs ordered are listed, but only abnormal results are displayed) Labs Reviewed - No data to display                                                                                                                       EKG  EKG Interpretation Date/Time:    Ventricular Rate:    PR Interval:    QRS Duration:    QT Interval:    QTC Calculation:   R Axis:      Text Interpretation:         Radiology No results found.  Medications Ordered in ED Medications  chlorhexidine  (PERIDEX ) 0.12 % solution 10 mL (10 mLs Mouth/Throat Not Given 04/08/24 0243)  lidocaine  (XYLOCAINE ) 2 % viscous mouth solution 15 mL (15 mLs Mouth/Throat Given 04/08/24 0243)    Procedures Procedures  (including critical care time) Medical Decision Making / ED Course   Medical Decision Making Risk Prescription drug management.    Gingival pain following extraction.  Signs of infection.  Still early in initial treatment to change antibiotics.  No obvious abscess for I&D.  Pain improved with viscous lidocaine  swish and spit.  Prescribe this plus Peridex .    Final Clinical Impression(s) / ED Diagnoses Final diagnoses:  Pain, dental   The patient appears reasonably screened and/or stabilized for discharge and I doubt any other medical condition or other [facility] requiring further screening, evaluation, or treatment in the ED at this time. I have discussed the findings, Dx and Tx plan with the patient/family who expressed understanding and agree(s) with the plan. Discharge instructions discussed at length. The patient/family was given strict return precautions who verbalized understanding of the instructions. No further questions at time of discharge.  Disposition: Discharge  Condition: Good  ED Discharge Orders          Ordered    chlorhexidine  (PERIDEX ) 0.12 % solution  2 times daily        04/08/24 0347    lidocaine  (XYLOCAINE ) 2 % solution  Every 6 hours PRN        04/08/24 0347            Follow Up: Dentist  Call  to schedule an appointment for close follow up    This chart was dictated using voice recognition software.  Despite best efforts to proofread,  errors can occur which can change the documentation meaning.    Lindle Rhea, MD 04/08/24 [phone]

## 2024-07-18 ENCOUNTER — Ambulatory Visit
Admission: RE | Admit: 2024-07-18 | Discharge: 2024-07-18 | Disposition: A | Discharge status: Home / Self Care | Source: Ambulatory Visit | Attending: Nurse Practitioner | Admitting: Nurse Practitioner

## 2024-07-18 VITALS — BP 114/73 | HR 61 | Temp 98.0°F | Resp 16

## 2024-07-18 DIAGNOSIS — Z113 Encounter for screening for infections with a predominantly sexual mode of transmission: Secondary | ICD-10-CM | POA: Insufficient documentation

## 2024-07-18 LAB — POCT URINE DIPSTICK
Bilirubin, UA: NEGATIVE
Blood, UA: NEGATIVE
Glucose, UA: NEGATIVE mg/dL
Ketones, POC UA: NEGATIVE mg/dL
Leukocytes, UA: NEGATIVE
Nitrite, UA: NEGATIVE
POC PROTEIN,UA: 30 — AB
Spec Grav, UA: 1.025 (ref 1.010–1.025)
Urobilinogen, UA: 0.2 U/dL
pH, UA: 6 (ref 5.0–8.0)

## 2024-07-18 NOTE — Discharge Instructions (Addendum)
 Testing for gonorrhea, chlamydia and trichomonas. You should not have any sexual activity until you receive the results of the tests. You will only be notified for positive results. You may go online to MyChart and review your results. Practice safe sex practices by wearing a condom every time you have sex. Remember that people who have STIs may not experience any symptoms. However, even without symptoms, these infections can be spread from person to person and require treatment. STIs can be treated, and many STIs can be cured. However, some STIs cannot be cured and will affect you for the rest of your life. It's important to be checked regularly for STIs. You should also consider taking pre-exposure prophylaxis (PrEP) to prevent HIV infection.

## 2024-07-18 NOTE — ED Provider Notes (Signed)
 [name]    CSN: [id number] Arrival date & time: 07/18/24  1111      History   Chief Complaint Chief Complaint  Patient presents with   SEXUALLY TRANSMITTED DISEASE    To get checked - Entered by patient    HPI Kyle Kelly is a 28 y.o. male.   Discussed the use of AI scribe software for clinical note transcription with the patient, who gave verbal consent to proceed.   The patient presents for an STD check. The patient reports a tingling sensation in the penis, which is not described as painful or burning. The tingling is localized to the tip or head of the penis. The patient is unable to specify the duration of this symptom. The patient denies any discharge from the penis, burning sensation during urination, blood in urine, genital sores, pain, vomiting, nausea, fevers, mouth pain, or mouth sores.   The following sections of the patient's history were reviewed and updated as appropriate: allergies, current medications, past family history, past medical history, past social history, past surgical history, and problem list.     Past Medical History:  Diagnosis Date   Asthma    not recent    Patient Active Problem List   Diagnosis Date Noted   Acute bronchitis 05/28/2017   Respiratory failure (HCC) 05/27/2017   Elevated blood pressure reading 12/03/2013    No past surgical history on file.     Home Medications    Prior to Admission medications   Medication Sig Start Date End Date Taking? Authorizing Provider  chlorhexidine  (PERIDEX ) 0.12 % solution Use as directed 15 mLs in the mouth or throat 2 (two) times daily. 04/08/24   Trine Raynell Moder, MD  clindamycin  (CLEOCIN ) 300 MG capsule Take 1 capsule (300 mg total) by mouth 4 (four) times daily. X 7 days 04/06/24   Geroldine Berg, MD  cloNIDine (CATAPRES) 0.2 MG tablet Take 0.2 mg by mouth at bedtime.    [provider]  HYDROcodone -acetaminophen  (NORCO/VICODIN) 5-325 MG tablet Take 1-2  tablets by mouth every 6 (six) hours as needed. 04/06/24   Geroldine Berg, MD  lidocaine  (XYLOCAINE ) 2 % solution Use as directed 5-10 mLs in the mouth or throat every 6 (six) hours as needed (dental pain). 04/08/24   Trine Raynell Moder, MD  lisdexamfetamine (VYVANSE) 60 MG capsule Take by mouth.    [provider]    Family History Family History  Problem Relation Age of Onset   Asthma Mother    Healthy Father     Social History Social History   Tobacco Use   Smoking status: Every Day    Types: Cigarettes   Smokeless tobacco: Never  Vaping Use   Vaping status: Never Used  Substance Use Topics   Alcohol use: Yes    Comment: Occassionally.   Drug use: Yes    Types: Marijuana    Comment: 1x weekly.     Allergies   Sulfonylureas, Elemental sulfur, and Sulfa antibiotics   Review of Systems Review of Systems  Constitutional:  Negative for fever.  HENT:  Negative for mouth sores.   Gastrointestinal:  Negative for nausea and vomiting.  Genitourinary:  Positive for dysuria (tingling sensation within the glans penis after urination). Negative for genital sores, hematuria and penile discharge.  All other systems reviewed and are negative.    Physical Exam Triage Vital Signs ED Triage Vitals [07/18/24 1202]  Encounter Vitals Group     BP 114/73  Girls Systolic BP Percentile      Girls Diastolic BP Percentile      Boys Systolic BP Percentile      Boys Diastolic BP Percentile      Pulse Rate 61     Resp 16     Temp 98 F (36.7 C)     Temp Source Oral     SpO2 96 %     Weight      Height      Head Circumference      Peak Flow      Pain Score      Pain Loc      Pain Education      Exclude from Growth Chart    No data found.  Updated Vital Signs BP 114/73 (BP Location: Right Arm)   Pulse 61   Temp 98 F (36.7 C) (Oral)   Resp 16   SpO2 96%   Visual Acuity Right Eye Distance:   Left Eye Distance:   Bilateral Distance:    Right Eye Near:    Left Eye Near:    Bilateral Near:     Physical Exam Constitutional:      General: He is not in acute distress.    Appearance: Normal appearance. He is not ill-appearing, toxic-appearing or diaphoretic.  HENT:     Head: Normocephalic.     Nose: Nose normal.     Mouth/Throat:     Mouth: Mucous membranes are moist.  Eyes:     Conjunctiva/sclera: Conjunctivae normal.  Cardiovascular:     Rate and Rhythm: Normal rate.  Pulmonary:     Effort: Pulmonary effort is normal.  Abdominal:     Palpations: Abdomen is soft.  Genitourinary:    Comments: Deferred; patient performed self-swab for Aptima testing  Musculoskeletal:        General: Normal range of motion.     Cervical back: Normal range of motion and neck supple.  Skin:    General: Skin is warm and dry.  Neurological:     General: No focal deficit present.     Mental Status: He is alert and oriented to person, place, and time.  Psychiatric:        Mood and Affect: Mood normal.        Behavior: Behavior normal.      UC Treatments / Results  Labs (all labs ordered are listed, but only abnormal results are displayed) Labs Reviewed  POCT URINE DIPSTICK - Abnormal; Notable for the following components:      Result Value   POC PROTEIN,UA =30 (*)    All other components within normal limits  CERVICOVAGINAL ANCILLARY ONLY    EKG   Radiology No results found.  Procedures Procedures (including critical care time)  Medications Ordered in UC Medications - No data to display  Initial Impression / Assessment and Plan / UC Course  I have reviewed the triage vital signs and the nursing notes.  Pertinent labs & imaging results that were available during my care of the patient were reviewed by me and considered in my medical decision making (see chart for details).     Patient presents for STD testing. Tests obtained today include gonorrhea, chlamydia and trichomonas. He declined HIV, and syphilis testing. Results are  pending. Patient was counseled to abstain from sexual activity until all results have been received, any necessary treatment has been completed, and any symptoms, if present, have resolved. Safe sex practices were discussed and encouraged, including consistent  condom use and regular screening with new partners. Patient advised they will only be contacted if any results are positive or require follow-up; otherwise, they may review their results through MyChart.  Today's evaluation has revealed no signs of a dangerous process. Discussed diagnosis with patient and/or guardian. Patient and/or guardian aware of their diagnosis, possible red flag symptoms to watch out for and need for close follow up. Patient and/or guardian understands verbal and written discharge instructions. Patient and/or guardian comfortable with plan and disposition.  Patient and/or guardian has a clear mental status at this time, good insight into illness (after discussion and teaching) and has clear judgment to make decisions regarding their care  Documentation was completed with the aid of voice recognition software. Transcription may contain typographical errors.   Final Clinical Impressions(s) / UC Diagnoses   Final diagnoses:  Screening examination for STD (sexually transmitted disease)     Discharge Instructions      Testing for gonorrhea, chlamydia and trichomonas. You should not have any sexual activity until you receive the results of the tests. You will only be notified for positive results. You may go online to MyChart and review your results. Practice safe sex practices by wearing a condom every time you have sex. Remember that people who have STIs may not experience any symptoms. However, even without symptoms, these infections can be spread from person to person and require treatment. STIs can be treated, and many STIs can be cured. However, some STIs cannot be cured and will affect you for the rest of your life.  It's important to be checked regularly for STIs. You should also consider taking pre-exposure prophylaxis (PrEP) to prevent HIV infection.      ED Prescriptions   None    PDMP not reviewed this encounter.   Iola Lukes, [place] 07/18/24 [phone]

## 2024-07-18 NOTE — ED Triage Notes (Signed)
 Pt presents for STD check. He would like blood work. Pt c/o right lower abdominal pain for a few days.

## 2024-07-19 LAB — CERVICOVAGINAL ANCILLARY ONLY
Bacterial Vaginitis (gardnerella): NEGATIVE
Candida Glabrata: NEGATIVE
Candida Vaginitis: NEGATIVE
Chlamydia: NEGATIVE
Comment: NEGATIVE
Comment: NEGATIVE
Comment: NEGATIVE
Comment: NEGATIVE
Comment: NEGATIVE
Comment: NORMAL
Neisseria Gonorrhea: NEGATIVE
Trichomonas: NEGATIVE

## 2024-07-22 ENCOUNTER — Ambulatory Visit: Payer: Self-pay | Admitting: Nurse Practitioner

## 2024-08-21 ENCOUNTER — Emergency Department (HOSPITAL_COMMUNITY)
Admission: EM | Admit: 2024-08-21 | Discharge: 2024-08-21 | Discharge status: Against Medical Advice | Payer: Self-pay | Attending: Emergency Medicine | Admitting: Emergency Medicine

## 2024-08-21 DIAGNOSIS — S61419A Laceration without foreign body of unspecified hand, initial encounter: Secondary | ICD-10-CM | POA: Insufficient documentation

## 2024-08-21 DIAGNOSIS — Z5321 Procedure and treatment not carried out due to patient leaving prior to being seen by health care provider: Secondary | ICD-10-CM | POA: Insufficient documentation

## 2024-08-21 NOTE — ED Notes (Signed)
 This ED Tech attempted to obtain vitals on this pt, but pt was not in correct triage room and in another room. Pt stated he refused to have vitals taken and wanted to be taken out of the system.
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
## 2024-09-17 ENCOUNTER — Ambulatory Visit
Admission: RE | Admit: 2024-09-17 | Discharge: 2024-09-17 | Disposition: A | Discharge status: Home / Self Care | Payer: Self-pay | Source: Ambulatory Visit | Attending: Student | Admitting: Student

## 2024-09-17 VITALS — BP 114/74 | HR 75 | Temp 98.0°F | Resp 16

## 2024-09-17 DIAGNOSIS — Z113 Encounter for screening for infections with a predominantly sexual mode of transmission: Secondary | ICD-10-CM | POA: Insufficient documentation

## 2024-09-17 LAB — POCT URINE DIPSTICK
Bilirubin, UA: NEGATIVE
Glucose, UA: NEGATIVE mg/dL
Ketones, POC UA: NEGATIVE mg/dL
Nitrite, UA: NEGATIVE
POC PROTEIN,UA: NEGATIVE
Spec Grav, UA: 1.02 (ref 1.010–1.025)
Urobilinogen, UA: 0.2 U/dL
pH, UA: 6 (ref 5.0–8.0)

## 2024-09-17 NOTE — Discharge Instructions (Addendum)
-  We are testing for gonorrhea, chlamydia, trichomonas, HIV, syphilis  -We will call with any positive results in about 3 business days and can send treatment if necessary. -We will not call with negative lab results. -Lab results will automatically go to your MyChart. -Avoid intercourse until you have completed treatment.

## 2024-09-17 NOTE — ED Provider Notes (Signed)
 [name]    CSN: [id number] Arrival date & time: 09/17/24  9063      History   Chief Complaint Chief Complaint  Patient presents with   SEXUALLY TRANSMITTED DISEASE    Need to be checked - Entered by patient    HPI Kyle Kelly is a 28 y.o. male Pt presents for STD testing. States he has had tingling when he urinating for about 1 week. New male partner. Denies abd pain, CVAT, penile discharge, penile rash or lesion, fevers.  HPI  Past Medical History:  Diagnosis Date   Asthma    not recent    Patient Active Problem List   Diagnosis Date Noted   Acute bronchitis 05/28/2017   Respiratory failure (HCC) 05/27/2017   Elevated blood pressure reading 12/03/2013    History reviewed. No pertinent surgical history.     Home Medications    Prior to Admission medications   Medication Sig Start Date End Date Taking? Authorizing Provider  chlorhexidine  (PERIDEX ) 0.12 % solution Use as directed 15 mLs in the mouth or throat 2 (two) times daily. 04/08/24   Trine Raynell Moder, MD  cloNIDine (CATAPRES) 0.2 MG tablet Take 0.2 mg by mouth at bedtime.    [provider]  HYDROcodone -acetaminophen  (NORCO/VICODIN) 5-325 MG tablet Take 1-2 tablets by mouth every 6 (six) hours as needed. 04/06/24   Geroldine Berg, MD  lidocaine  (XYLOCAINE ) 2 % solution Use as directed 5-10 mLs in the mouth or throat every 6 (six) hours as needed (dental pain). 04/08/24   Trine Raynell Moder, MD  lisdexamfetamine (VYVANSE) 60 MG capsule Take by mouth.    [provider]    Family History Family History  Problem Relation Age of Onset   Asthma Mother    Healthy Father     Social History Social History   Tobacco Use   Smoking status: Every Day    Types: Cigarettes   Smokeless tobacco: Never  Vaping Use   Vaping status: Never Used  Substance Use Topics   Alcohol use: Yes    Comment: Occassionally.   Drug use: Yes    Types: Marijuana    Comment: 1x  weekly.     Allergies   Sulfonylureas, Elemental sulfur, and Sulfa antibiotics   Review of Systems Review of Systems  Gastrointestinal:  Negative for abdominal pain.  Genitourinary:  Negative for decreased urine volume, difficulty urinating, dysuria, flank pain, frequency, hematuria, penile discharge, penile pain, penile swelling, scrotal swelling, testicular pain and urgency.       Tingling with urination     Physical Exam Triage Vital Signs ED Triage Vitals  Encounter Vitals Group     BP 09/17/24 0944 114/74     Girls Systolic BP Percentile --      Girls Diastolic BP Percentile --      Boys Systolic BP Percentile --      Boys Diastolic BP Percentile --      Pulse Rate 09/17/24 0944 75     Resp 09/17/24 0944 16     Temp 09/17/24 0944 98 F (36.7 C)     Temp Source 09/17/24 0944 Oral     SpO2 09/17/24 0944 97 %     Weight --      Height --      Head Circumference --      Peak Flow --      Pain Score 09/17/24 0942 0     Pain Loc --  Pain Education --      Exclude from Growth Chart --    No data found.  Updated Vital Signs BP 114/74 (BP Location: Right Arm)   Pulse 75   Temp 98 F (36.7 C) (Oral)   Resp 16   SpO2 97%   Visual Acuity Right Eye Distance:   Left Eye Distance:   Bilateral Distance:    Right Eye Near:   Left Eye Near:    Bilateral Near:     Physical Exam Vitals reviewed.  Constitutional:      General: He is not in acute distress.    Appearance: Normal appearance. He is not ill-appearing.  HENT:     Head: Normocephalic and atraumatic.     Mouth/Throat:     Mouth: Mucous membranes are moist.     Comments: Moist mucous membranes Eyes:     Extraocular Movements: Extraocular movements intact.     Pupils: Pupils are equal, round, and reactive to light.  Cardiovascular:     Rate and Rhythm: Normal rate and regular rhythm.     Heart sounds: Normal heart sounds.  Pulmonary:     Effort: Pulmonary effort is normal.     Breath sounds:  Normal breath sounds. No wheezing, rhonchi or rales.  Abdominal:     General: Bowel sounds are normal. There is no distension.     Palpations: Abdomen is soft. There is no mass.     Tenderness: There is no abdominal tenderness. There is no right CVA tenderness, left CVA tenderness, guarding or rebound.  Skin:    General: Skin is warm.     Capillary Refill: Capillary refill takes less than 2 seconds.     Comments: Good skin turgor  Neurological:     General: No focal deficit present.     Mental Status: He is alert and oriented to person, place, and time.  Psychiatric:        Mood and Affect: Mood normal.        Behavior: Behavior normal.      UC Treatments / Results  Labs (all labs ordered are listed, but only abnormal results are displayed) Labs Reviewed  HIV ANTIBODY (ROUTINE TESTING W REFLEX)  RPR  POCT URINE DIPSTICK  CYTOLOGY, (ORAL, ANAL, URETHRAL) ANCILLARY ONLY    EKG   Radiology No results found.  Procedures Procedures (including critical care time)  Medications Ordered in UC Medications - No data to display  Initial Impression / Assessment and Plan / UC Course  I have reviewed the triage vital signs and the nursing notes.  Pertinent labs & imaging results that were available during my care of the patient were reviewed by me and considered in my medical decision making (see chart for details).     Patient is a 28 year old male presenting for STI screening. The patient is afebrile and nontachycardic.  Antipyretic has not been administered today.  We are testing for gonorrhea, chlamydia, trichomonas; HIV, syphilis.  Will wait for results to treat.  Safe sex precautions  Final Clinical Impressions(s) / UC Diagnoses   Final diagnoses:  Routine screening for STI (sexually transmitted infection)     Discharge Instructions      -We are testing for gonorrhea, chlamydia, trichomonas, HIV, syphilis  -We will call with any positive results in about 3 business  days and can send treatment if necessary. -We will not call with negative lab results. -Lab results will automatically go to your MyChart. -Avoid intercourse until you have completed treatment.  ED Prescriptions   None    PDMP not reviewed this encounter.   Arlyss Leita BRAVO, PA-C 09/17/24 1012

## 2024-09-17 NOTE — ED Triage Notes (Signed)
 Pt presents for STD testing. States he has had tingling when he urinating for about 1 week.

## 2024-09-18 ENCOUNTER — Ambulatory Visit (HOSPITAL_COMMUNITY): Payer: Self-pay

## 2024-09-18 LAB — CYTOLOGY, (ORAL, ANAL, URETHRAL) ANCILLARY ONLY
Chlamydia: POSITIVE — AB
Comment: NEGATIVE
Comment: NEGATIVE
Comment: NORMAL
Neisseria Gonorrhea: NEGATIVE
Trichomonas: POSITIVE — AB

## 2024-09-18 LAB — RPR: RPR Ser Ql: NONREACTIVE

## 2024-09-18 LAB — HIV ANTIBODY (ROUTINE TESTING W REFLEX): HIV Screen 4th Generation wRfx: NONREACTIVE

## 2024-09-18 MED ORDER — DOXYCYCLINE HYCLATE 100 MG PO TABS: 100.0000 mg | ORAL_TABLET | Freq: Two times a day (BID) | ORAL | 0 refills | Status: AC

## 2024-09-18 MED ORDER — METRONIDAZOLE 500 MG PO TABS: 2000.0000 mg | ORAL_TABLET | Freq: Once | ORAL | 0 refills | Status: AC
